# Patient Record
Sex: Female | Born: 1980 | Race: White | Hispanic: No | Marital: Single | Smoking: Former smoker
Health system: Southern US, Community
[De-identification: ages and names within clinical notes are randomized; demographics above are authoritative.]

## PROBLEM LIST (undated history)

## (undated) ENCOUNTER — Inpatient Hospital Stay: Payer: Self-pay

## (undated) DIAGNOSIS — L732 Hidradenitis suppurativa: Secondary | ICD-10-CM

## (undated) DIAGNOSIS — D649 Anemia, unspecified: Secondary | ICD-10-CM

## (undated) DIAGNOSIS — I10 Essential (primary) hypertension: Secondary | ICD-10-CM

## (undated) DIAGNOSIS — D060 Carcinoma in situ of endocervix: Secondary | ICD-10-CM

## (undated) DIAGNOSIS — I8392 Asymptomatic varicose veins of left lower extremity: Secondary | ICD-10-CM

## (undated) DIAGNOSIS — G5603 Carpal tunnel syndrome, bilateral upper limbs: Secondary | ICD-10-CM

## (undated) DIAGNOSIS — M2550 Pain in unspecified joint: Secondary | ICD-10-CM

## (undated) DIAGNOSIS — O139 Gestational [pregnancy-induced] hypertension without significant proteinuria, unspecified trimester: Secondary | ICD-10-CM

## (undated) DIAGNOSIS — G4730 Sleep apnea, unspecified: Secondary | ICD-10-CM

## (undated) DIAGNOSIS — G43909 Migraine, unspecified, not intractable, without status migrainosus: Secondary | ICD-10-CM

## (undated) DIAGNOSIS — I7300 Raynaud's syndrome without gangrene: Secondary | ICD-10-CM

---

## 2005-01-22 ENCOUNTER — Emergency Department: Payer: Self-pay | Admitting: Emergency Medicine

## 2009-11-13 ENCOUNTER — Ambulatory Visit: Payer: Self-pay | Admitting: Family Medicine

## 2010-01-31 ENCOUNTER — Emergency Department: Payer: Self-pay | Admitting: Emergency Medicine

## 2010-06-18 ENCOUNTER — Ambulatory Visit: Payer: Self-pay | Admitting: Family Medicine

## 2013-10-31 DIAGNOSIS — G4733 Obstructive sleep apnea (adult) (pediatric): Secondary | ICD-10-CM | POA: Insufficient documentation

## 2013-10-31 DIAGNOSIS — G4486 Cervicogenic headache: Secondary | ICD-10-CM | POA: Insufficient documentation

## 2013-12-15 ENCOUNTER — Ambulatory Visit: Payer: Self-pay | Admitting: Neurology

## 2015-11-14 DIAGNOSIS — M2550 Pain in unspecified joint: Secondary | ICD-10-CM | POA: Insufficient documentation

## 2015-11-28 DIAGNOSIS — R937 Abnormal findings on diagnostic imaging of other parts of musculoskeletal system: Secondary | ICD-10-CM | POA: Insufficient documentation

## 2016-01-09 ENCOUNTER — Ambulatory Visit
Admission: EM | Admit: 2016-01-09 | Discharge: 2016-01-09 | Disposition: A | Discharge status: Home / Self Care | Payer: Managed Care, Other (non HMO) | Attending: Family Medicine | Admitting: Family Medicine

## 2016-01-09 ENCOUNTER — Encounter: Payer: Self-pay | Admitting: Emergency Medicine

## 2016-01-09 DIAGNOSIS — N390 Urinary tract infection, site not specified: Secondary | ICD-10-CM

## 2016-01-09 HISTORY — DX: Pain in unspecified joint: M25.50

## 2016-01-09 HISTORY — DX: Migraine, unspecified, not intractable, without status migrainosus: G43.909

## 2016-01-09 LAB — PREGNANCY, URINE: Preg Test, Ur: NEGATIVE

## 2016-01-09 LAB — URINALYSIS COMPLETE WITH MICROSCOPIC (ARMC ONLY)
BILIRUBIN URINE: NEGATIVE
GLUCOSE, UA: NEGATIVE mg/dL
KETONES UR: NEGATIVE mg/dL
Nitrite: POSITIVE — AB
Protein, ur: 30 mg/dL — AB
SPECIFIC GRAVITY, URINE: 1.02 (ref 1.005–1.030)
pH: 5.5 (ref 5.0–8.0)

## 2016-01-09 MED ORDER — ONDANSETRON 8 MG PO TBDP
8.0000 mg | ORAL_TABLET | Freq: Two times a day (BID) | ORAL | Status: DC
Start: 2016-01-09 — End: 2017-02-09

## 2016-01-09 MED ORDER — CIPROFLOXACIN HCL 500 MG PO TABS: 500.0000 mg | ORAL_TABLET | Freq: Two times a day (BID) | ORAL | Status: DC

## 2016-01-09 NOTE — ED Provider Notes (Signed)
CSN: [id number]     Arrival date & time 01/09/16  0849 History   First MD Initiated Contact with Patient 01/09/16 [phone]     Chief Complaint  Patient presents with  . Abdominal Pain   (Consider location/radiation/quality/duration/timing/severity/associated sxs/prior Treatment) Patient is a 35 y.o. female presenting with dysuria. The history is provided by the patient.  Dysuria Pain quality:  Burning Pain severity:  Mild Onset quality:  Sudden Duration:  3 days Timing:  Constant Progression:  Worsening Chronicity:  New Recent urinary tract infections: no   Relieved by:  None tried Ineffective treatments:  None tried Urinary symptoms: discolored urine and frequent urination   Urinary symptoms: no foul-smelling urine, no hematuria, no hesitancy and no bladder incontinence   Associated symptoms: abdominal pain and nausea   Associated symptoms: no fever, no flank pain, no genital lesions, no vaginal discharge and no vomiting   Risk factors: no hx of pyelonephritis, no hx of urolithiasis, no kidney transplant, not pregnant, no recurrent urinary tract infections, no renal cysts, no renal disease, not sexually active, not single kidney, no sexually transmitted infections and no urinary catheter     Past Medical History  Diagnosis Date  . Joint pain   . Migraines    History reviewed. No pertinent past surgical history. History reviewed. No pertinent family history. Social History  Substance Use Topics  . Smoking status: Former Games developer  . Smokeless tobacco: None  . Alcohol Use: Yes   OB History    No data available     Review of Systems  Constitutional: Negative for fever.  Gastrointestinal: Positive for nausea and abdominal pain. Negative for vomiting.  Genitourinary: Positive for dysuria. Negative for flank pain and vaginal discharge.    Allergies  Penicillins  Home Medications   Prior to Admission medications   Medication Sig Start Date End Date Taking? Authorizing  Provider  Cyclobenzaprine HCl (FLEXERIL PO) Take by mouth.   Yes Historical Provider, MD  MELOXICAM PO Take 7.5 mg by mouth.   Yes Historical Provider, MD  Norgestim-Eth Estrad Triphasic (TRI-PREVIFEM PO) Take by mouth.   Yes Historical Provider, MD  promethazine (PHENERGAN) 12.5 MG tablet Take 25 mg by mouth every 6 (six) hours as needed for nausea or vomiting.   Yes Historical Provider, MD  SUMATRIPTAN SUCCINATE PO Take by mouth.   Yes Historical Provider, MD  topiramate (TOPAMAX) 100 MG tablet Take 100 mg by mouth 2 (two) times daily.   Yes Historical Provider, MD  ciprofloxacin (CIPRO) 500 MG tablet Take 1 tablet (500 mg total) by mouth every 12 (twelve) hours. 01/09/16   Payton Mccallum, MD  ondansetron (ZOFRAN ODT) 8 MG disintegrating tablet Take 1 tablet (8 mg total) by mouth 2 (two) times daily. 01/09/16   Payton Mccallum, MD   Meds Ordered and Administered this Visit  Medications - No data to display  BP 132/71 mmHg  Pulse 61  Temp(Src) 98.1 F (36.7 C) (Oral)  Resp 16  Ht  (1.626 m)  Wt 175 lb (79.379 kg)  BMI 30.02 kg/m2  SpO2 100%  LMP 12/05/2015 (Approximate) No data found.   Physical Exam  Constitutional: She appears well-developed and well-nourished. No distress.  Abdominal: Soft. Bowel sounds are normal. She exhibits no distension and no mass. There is tenderness (mild, suprapubic). There is no rebound and no guarding.  Skin: She is not diaphoretic.  Nursing note and vitals reviewed.   ED Course  Procedures (including critical care time)  Labs Review Labs Reviewed  URINALYSIS COMPLETEWITH MICROSCOPIC ([facility] ONLY) - Abnormal; Notable for the following:    APPearance HAZY (*)    Hgb urine dipstick MODERATE (*)    Protein, ur 30 (*)    Nitrite POSITIVE (*)    Leukocytes, UA SMALL (*)    Bacteria, UA MANY (*)    Squamous Epithelial / LPF 0-5 (*)    All other components within normal limits  PREGNANCY, URINE    Imaging Review No results found.   Visual  Acuity Review  Right Eye Distance:   Left Eye Distance:   Bilateral Distance:    Right Eye Near:   Left Eye Near:    Bilateral Near:         MDM   1. UTI (lower urinary tract infection)    Discharge Medication List as of 01/09/2016 10:45 AM    START taking these medications   Details  ciprofloxacin (CIPRO) 500 MG tablet Take 1 tablet (500 mg total) by mouth every 12 (twelve) hours., Starting 01/09/2016, Until Discontinued, Normal    ondansetron (ZOFRAN ODT) 8 MG disintegrating tablet Take 1 tablet (8 mg total) by mouth 2 (two) times daily., Starting 01/09/2016, Until Discontinued, Normal       1. diagnosis reviewed with patient 2. rx as per orders above; reviewed possible side effects, interactions, risks and benefits  3. Recommend supportive treatment with increased water intake  4. Follow-up prn if symptoms worsen or don't improve    Payton Mccallumrlando Keegan Bensch, MD 01/09/16 1059

## 2016-01-09 NOTE — Discharge Instructions (Signed)
Asymptomatic Bacteriuria, Female Asymptomatic bacteriuria is the presence of a large number of bacteria in your urine without the usual symptoms of burning or frequent urination. The following conditions increase the risk of asymptomatic bacteriuria:  Diabetes mellitus.  Advanced age.  Pregnancy in the first trimester.  Kidney stones.  Kidney transplants.  Leaky kidney tube valve in young children (reflux). Treatment for this condition is not needed in most people and can lead to other problems such as too much yeast and growth of resistant bacteria. However, some people, such as pregnant women, do need treatment to prevent kidney infection. Asymptomatic bacteriuria in pregnancy is also associated with fetal growth restriction, premature labor, and newborn death. HOME CARE INSTRUCTIONS Monitor your condition for any changes. The following actions may help to relieve any discomfort you are feeling:  Drink enough water and fluids to keep your urine clear or pale yellow. Go to the bathroom more often to keep your bladder empty.  Keep the area around your vagina and rectum clean. Wipe yourself from front to back after urinating. SEEK IMMEDIATE MEDICAL CARE IF:  You develop signs of an infection such as:  Burning with urination.  Frequency of voiding.  Back pain.  Fever.  You have blood in the urine.  You develop a fever. MAKE SURE YOU:  Understand these instructions.  Will watch your condition.  Will get help right away if you are not doing well or get worse.   This information is not intended to replace advice given to you by your health care provider. Make sure you discuss any questions you have with your health care provider.   Document Released: 06/09/2005 Document Revised: 06/30/2014 Document Reviewed: 11/29/2012 Elsevier Interactive Patient Education 2016 [employer] Inc[employer]  

## 2016-01-09 NOTE — ED Notes (Signed)
Pt reports 3-4 days of abdominal pain, had some urinary frequency and urgency but that went away. Pt reports on birth control and not having period yet. Pt has nausea and lower abd pain. Denies fever or chills.

## 2016-04-02 DIAGNOSIS — M542 Cervicalgia: Secondary | ICD-10-CM | POA: Insufficient documentation

## 2016-10-01 DIAGNOSIS — I7300 Raynaud's syndrome without gangrene: Secondary | ICD-10-CM | POA: Insufficient documentation

## 2017-02-09 ENCOUNTER — Ambulatory Visit
Admission: EM | Admit: 2017-02-09 | Discharge: 2017-02-09 | Disposition: A | Discharge status: Home / Self Care | Payer: Managed Care, Other (non HMO) | Attending: Family Medicine | Admitting: Family Medicine

## 2017-02-09 ENCOUNTER — Encounter: Payer: Self-pay | Admitting: Emergency Medicine

## 2017-02-09 DIAGNOSIS — M545 Low back pain: Secondary | ICD-10-CM

## 2017-02-09 DIAGNOSIS — S39012A Strain of muscle, fascia and tendon of lower back, initial encounter: Secondary | ICD-10-CM

## 2017-02-09 MED ORDER — HYDROCODONE-ACETAMINOPHEN 5-325 MG PO TABS: ORAL_TABLET | ORAL | 0 refills | Status: DC
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
## 2017-06-23 NOTE — L&D Delivery Note (Signed)
Delivery Note Date of delivery: 01/26/2018 Estimated Date of Delivery: 02/15/18 Patient's last menstrual period was 06/06/2017. EGA: 6221w1d  First Stage: Induction: misoprostol Augmentation: AROM, pitocin Analgesia Eliezer Lofts/Anesthesia intrapartum: epidural AROM at 1400 01/25/18  Theresa Davidson presented to L&D for IOL due to gHTN. She was induced with misoprostol, then augmented with AROM and pitocin. Epidural placed for pain relief.   Second Stage: Complete dilation at 1002 01/26/18 Onset of pushing at 1026 01/26/18 FHR second stage: category II for variable decels with contractions, overall reassuring with moderate variability and accels Delivery at 1244 on 01/26/2018  She slowly progressed to complete, with pitocin paused multiple times for periods of decelerations. She had a spontaneous vaginal birth of a live female over an intact perineum. The fetal head was delivered in direct OA position with restitution to LOA. No nuchal cord. Anterior then posterior shoulders delivered spontaneously. Baby placed on mom's abdomen and attended to by transition RN. Cord clamped after 3-4 minute delay by aunt of the baby. Cord blood obtained for newborn labs.  Third Stage: Placenta delivered spontaneously intact with 3VC at 1258 Placenta disposition: routine disposal Uterine tone firm / bleeding scant IV pitocin given for hemorrhage prophylyaxis  B/l labial and 1st degree left vaginal wall lacerations identified  Anesthesia for repair: epidural Repair: 3-0 Vicryl Rapide CT to left labial and left vaginal wall Quantitative Blood Loss (mL): 237  Complications: none  Mom to postpartum.  Baby to Couplet care / Skin to Skin.  Newborn: Birth Weight: 6lb 9.8oz 3000g  Apgar Scores: 9, 9 Feeding planned: formula   Genia DelMargaret Abe Schools, CNM 01/26/2018 1:29 PM

## 2017-09-08 ENCOUNTER — Other Ambulatory Visit: Payer: Self-pay | Admitting: Obstetrics and Gynecology

## 2017-09-08 DIAGNOSIS — Z1231 Encounter for screening mammogram for malignant neoplasm of breast: Secondary | ICD-10-CM

## 2017-09-15 ENCOUNTER — Encounter (INDEPENDENT_AMBULATORY_CARE_PROVIDER_SITE_OTHER): Payer: Self-pay

## 2017-09-15 ENCOUNTER — Ambulatory Visit
Admission: RE | Admit: 2017-09-15 | Discharge: 2017-09-15 | Disposition: A | Discharge status: Home / Self Care | Payer: Commercial Managed Care - PPO | Source: Ambulatory Visit | Attending: Obstetrics and Gynecology | Admitting: Obstetrics and Gynecology

## 2017-09-15 DIAGNOSIS — Z1231 Encounter for screening mammogram for malignant neoplasm of breast: Secondary | ICD-10-CM | POA: Diagnosis not present

## 2017-09-29 DIAGNOSIS — O2390 Unspecified genitourinary tract infection in pregnancy, unspecified trimester: Secondary | ICD-10-CM | POA: Insufficient documentation

## 2017-09-29 DIAGNOSIS — O09522 Supervision of elderly multigravida, second trimester: Secondary | ICD-10-CM | POA: Insufficient documentation

## 2017-09-29 LAB — OB RESULTS CONSOLE HIV ANTIBODY (ROUTINE TESTING): HIV: NONREACTIVE

## 2017-09-29 LAB — OB RESULTS CONSOLE RPR: RPR: NONREACTIVE

## 2017-09-29 LAB — OB RESULTS CONSOLE VARICELLA ZOSTER ANTIBODY, IGG: VARICELLA IGG: IMMUNE

## 2017-09-29 LAB — OB RESULTS CONSOLE RUBELLA ANTIBODY, IGM: Rubella: IMMUNE

## 2017-09-29 LAB — OB RESULTS CONSOLE GC/CHLAMYDIA
Chlamydia: NEGATIVE
GC PROBE AMP, GENITAL: NEGATIVE

## 2017-09-29 LAB — OB RESULTS CONSOLE HEPATITIS B SURFACE ANTIGEN: HEP B S AG: NEGATIVE

## 2017-10-01 ENCOUNTER — Other Ambulatory Visit: Payer: Self-pay | Admitting: Obstetrics and Gynecology

## 2017-10-01 DIAGNOSIS — Z3689 Encounter for other specified antenatal screening: Secondary | ICD-10-CM

## 2017-10-08 ENCOUNTER — Ambulatory Visit (HOSPITAL_BASED_OUTPATIENT_CLINIC_OR_DEPARTMENT_OTHER)
Admission: RE | Admit: 2017-10-08 | Discharge: 2017-10-08 | Disposition: A | Discharge status: Home / Self Care | Payer: Commercial Managed Care - PPO | Source: Ambulatory Visit | Attending: Obstetrics and Gynecology | Admitting: Obstetrics and Gynecology

## 2017-10-08 ENCOUNTER — Ambulatory Visit
Admission: RE | Admit: 2017-10-08 | Discharge: 2017-10-08 | Disposition: A | Discharge status: Home / Self Care | Payer: Commercial Managed Care - PPO | Source: Ambulatory Visit | Attending: Maternal & Fetal Medicine | Admitting: Maternal & Fetal Medicine

## 2017-10-08 ENCOUNTER — Ambulatory Visit: Payer: Commercial Managed Care - PPO

## 2017-10-08 DIAGNOSIS — O281 Abnormal biochemical finding on antenatal screening of mother: Secondary | ICD-10-CM | POA: Insufficient documentation

## 2017-10-08 DIAGNOSIS — O09512 Supervision of elderly primigravida, second trimester: Secondary | ICD-10-CM | POA: Diagnosis not present

## 2017-10-08 DIAGNOSIS — O280 Abnormal hematological finding on antenatal screening of mother: Secondary | ICD-10-CM | POA: Diagnosis not present

## 2017-10-08 DIAGNOSIS — Z3689 Encounter for other specified antenatal screening: Secondary | ICD-10-CM

## 2017-10-08 DIAGNOSIS — Z3A21 21 weeks gestation of pregnancy: Secondary | ICD-10-CM | POA: Diagnosis not present

## 2017-10-08 DIAGNOSIS — O09513 Supervision of elderly primigravida, third trimester: Secondary | ICD-10-CM | POA: Insufficient documentation

## 2017-10-08 NOTE — Progress Notes (Addendum)
Referring Provider:   Milon Score Length of Consultation:40 minutes  Ms. Snooks was referred to [facility] for genetic counseling because of maternal age and an abnormal maternal serum screen.  The patient will be 37 years old at the time of delivery.  This note summarizes the information we discussed.    We explained that the chance of a chromosome abnormality increases with maternal age.  Chromosomes and examples of chromosome problems were reviewed.  Humans typically have 46 chromosomes in each cell, with half passed through each sperm and egg.  Any change in the number or structure of chromosomes can increase the risk of problems in the physical and mental development of a pregnancy.   Based upon age of the patient, the chance of any chromosome abnormality was 1 in 54.  The chance of Down syndrome, the most common chromosome problem associated with maternal age, was 1 in 200.  The risk of chromosome problems is in addition to the 3% general population risk for birth defects and mental retardation.  The greatest chance, of course, is that the baby would be born in good health.  Maternal serum marker screening, a blood test that measures pregnancy proteins, can provide risk assessments for Down syndrome, trisomy 18, and open neural tube defects (spina bifida, anencephaly). Because it does not directly examine the fetus, it cannot positively diagnose or rule out these problems. Ms. Manzer had maternal serum screening through her ob/gyn.  The maternal serum screen revealed protein levels that increased the chance of Down syndrome in the pregnancy.  Given the maternal serum screen results, the chance for the baby to have Down Syndrome is now estimated to be 1 in 191.  This means that the chance the baby does not have Down syndrome is greater than 99 percent.  We discussed the following prenatal screening and testing options for this pregnancy:  Targeted ultrasound uses high  frequency sound waves to create an image of the developing fetus.  An ultrasound is often recommended as a routine means of evaluating the pregnancy.  It is also used to screen for fetal anatomy problems (for example, a heart defect) that might be suggestive of a chromosomal or other abnormality.   Amniocentesis involves the removal of a small amount of amniotic fluid from the sac surrounding the fetus with the use of a thin needle inserted through the maternal abdomen and uterus.  Ultrasound guidance is used throughout the procedure.  Fetal cells from amniotic fluid are directly evaluated and > 99.5% of chromosome problems and > 98% of open neural tube defects can be detected. This procedure is generally performed after the 15th week of pregnancy.  The main risks to this procedure include complications leading to miscarriage in less than 1 in 200 cases (0.5%).  We also reviewed the availability of cell free fetal DNA testing from maternal blood to determine whether or not the baby may have either Down syndrome, trisomy 76, or trisomy 45.  This test utilizes a maternal blood sample and DNA sequencing technology to isolate circulating cell free fetal DNA from maternal plasma.  The fetal DNA can then be analyzed for DNA sequences that are derived from the three most common chromosomes involved in aneuploidy, chromosomes 13, 18, and 21.  If the overall amount of DNA is greater than the expected level for any of these chromosomes, aneuploidy is suspected.  While we do not consider it a replacement for invasive testing and karyotype analysis, a negative result  from this testing would be reassuring, though not a guarantee of a normal chromosome complement for the baby.  An abnormal result is certainly suggestive of an abnormal chromosome complement, though we would still recommend amniocentesis to confirm any findings from this testing.  Cystic Fibrosis and Spinal Muscular Atrophy (SMA) screening were also discussed  with the patient. Both conditions are recessive, which means that both parents must be carriers in order to have a child with the disease.  Cystic fibrosis (CF) is one of the most common genetic conditions in persons of Caucasian ancestry.  This condition occurs in approximately 1 in 2,500 Caucasian persons and results in thickened secretions in the lungs, digestive, and reproductive systems.  For a baby to be at risk for having CF, both of the parents must be carriers for this condition.  Approximately 1 in 3 Caucasian persons is a carrier for CF.  Current carrier testing looks for the most common mutations in the gene for CF and can detect approximately 90% of carriers in the Caucasian population.  This means that the carrier screening can greatly reduce, but cannot eliminate, the chance for an individual to have a child with CF.  If an individual is found to be a carrier for CF, then carrier testing would be available for the partner. As part of [place]'s newborn screening profile, all babies born in the state of [place] will have a two-tier screening process.  Specimens are first tested to determine the concentration of immunoreactive trypsinogen (IRT).  The top 5% of specimens with the highest IRT values then undergo DNA testing using a panel of over 40 common CF mutations. SMA is a neurodegenerative disorder that leads to atrophy of skeletal muscle and overall weakness.  This condition is also more prevalent in the Caucasian population, with 1 in 40-1 in 60 persons being a carrier and 1 in 6,000-1 in 10,000 children being affected.  There are multiple forms of the disease, with some causing death in infancy to other forms with survival into adulthood.  The genetics of SMA is complex, but carrier screening can detect up to 95% of carriers in the Caucasian population.  Similar to CF, a negative result can greatly reduce, but cannot eliminate, the chance to have a child with SMA.  We obtained a  detailed family history and pregnancy history.  The patient reported that this is her first pregnancy.  The father of the baby has two healthy children from a prior relationship.  The patient stated that her father and one of his brothers have been diagnosed with retinitis pigmentosa (RP), an inherited eye disorder.  There are many forms of RP that may be inherited in different ways.  Without additional information about possible genetic testing that they have had, it is difficult to determine the specific risks for other family members to have RP.  If more is learned, we are happy to discuss this further.  She also stated that her mother passed away from complications or scleroderma and lupus.  These autoimmune conditions are not uncommon to occur together in individuals.  Ms. Garlick's sister is also said to have lupus.  It is known that a small percentage of all cases of systemic scleroderma have been reported to run in families; however, the condition does not have a clear pattern of inheritance. Multiple genetic and environmental factors likely play a part in determining the risk of developing this condition.  This is likely also the case for lupus. The remainder  of the family history was reported to be unremarkable for birth defects, developmental delays, recurrent pregnancy loss or known chromosome abnormalities.   Ms.  Stephania FragminHelm reported no complications in this pregnancy.  She did report taking Qudexy for migraines prior to learning she was pregnant at [redacted] weeks gestation.  We reviewed that there is limited data on this medication, but that one study did suggest an increased chance for cleft lip or palate.  The nose/lips views were normal today by ultrasound.  She is also taking Maloxicam, a non steriodal anti-inflammatory medication.  There is little data on the use of this medication in human pregnancy, but animal studies have not shown an increased risk.  Similar medications are not recommended late in  gestation due to the risk for premature closure of the PDA.  After consideration of the options, the patient elected to proceed with an ultrsaound only.  She declined cell free fetal DNA testing as well as amniocentesis.  She also declined CF and SMA carrier screening.  An ultrasound was performed at the time of the visit.  The gestational age was consistent with 21 weeks and 3 days.  No markers of aneuploidy were seen.  It is important to remember that ultrasound can not diagnose chromosome abnormalities or all birth defects.  Ms. Stephania FragminHelm was encouraged to call with questions or concerns.  We can be contacted at [phone].  Cherly Andersoneborah F. Wells, MS, CGC  I was immediately available and supervising. Argentina PonderAndra H. Arlington Sigmund, MD [facility]

## 2017-11-05 NOTE — Addendum Note (Signed)
Encounter addended by: Ezzie Senat, MD on: 11/05/2017 8:42 AM  Actions taken: Sign clinical note

## 2017-11-27 ENCOUNTER — Observation Stay
Admission: EM | Admit: 2017-11-27 | Discharge: 2017-11-27 | Disposition: A | Discharge status: Home / Self Care | Payer: Commercial Managed Care - PPO | Attending: Obstetrics & Gynecology | Admitting: Obstetrics & Gynecology

## 2017-11-27 ENCOUNTER — Encounter: Payer: Self-pay | Admitting: *Deleted

## 2017-11-27 DIAGNOSIS — O368130 Decreased fetal movements, third trimester, not applicable or unspecified: Secondary | ICD-10-CM | POA: Diagnosis present

## 2017-11-27 DIAGNOSIS — Z3A28 28 weeks gestation of pregnancy: Secondary | ICD-10-CM | POA: Insufficient documentation

## 2017-11-27 DIAGNOSIS — Z3490 Encounter for supervision of normal pregnancy, unspecified, unspecified trimester: Secondary | ICD-10-CM

## 2017-11-27 NOTE — OB Triage Note (Signed)
Recvd pt from ED. Pt c/o of decreased fetal movement since yesterday morning. No complaints of contractions, vaginal bleeding or LOF.

## 2017-11-27 NOTE — Discharge Instructions (Signed)
Come back if: ° °Big gush of fluids °Decreased fetal movement °Temp over 100.4 °Heavy vaginal bleeding °Contractions every 3-5 min lasting at least one hour ° °Get plenty of rest and stay well hydrated! °

## 2017-11-27 NOTE — Discharge Summary (Signed)
Patient's last menstrual period was 06/06/2017. EDC: Estimated Date of Delivery: 02/15/18 EGA: 4251w4d  Patient presented for evaluation of decreased fetal movement.  Patient had not felt fetal movement in 24 hours, so reported here.  Upon arrival, she felt fetal activity, which was reported to me. I reviewed her vital signs and fetal tracing, both of which were reassuring.  Patient was discharged as fetal movement had normalized.  NST interpretation: Reactive.  Ranae Plumberhelsea Ward, MD Attending Obstetrician and Gynecologist [facility]

## 2018-01-15 DIAGNOSIS — O280 Abnormal hematological finding on antenatal screening of mother: Secondary | ICD-10-CM | POA: Insufficient documentation

## 2018-01-21 ENCOUNTER — Other Ambulatory Visit: Payer: Self-pay | Admitting: Obstetrics and Gynecology

## 2018-01-21 ENCOUNTER — Other Ambulatory Visit: Payer: Self-pay

## 2018-01-21 ENCOUNTER — Observation Stay
Admission: EM | Admit: 2018-01-21 | Discharge: 2018-01-21 | Disposition: A | Discharge status: Home / Self Care | Payer: Commercial Managed Care - PPO | Attending: Obstetrics and Gynecology | Admitting: Obstetrics and Gynecology

## 2018-01-21 DIAGNOSIS — Z3A36 36 weeks gestation of pregnancy: Secondary | ICD-10-CM | POA: Diagnosis not present

## 2018-01-21 DIAGNOSIS — Z79899 Other long term (current) drug therapy: Secondary | ICD-10-CM | POA: Diagnosis not present

## 2018-01-21 DIAGNOSIS — R030 Elevated blood-pressure reading, without diagnosis of hypertension: Secondary | ICD-10-CM | POA: Diagnosis present

## 2018-01-21 DIAGNOSIS — Z880 Allergy status to penicillin: Secondary | ICD-10-CM | POA: Insufficient documentation

## 2018-01-21 DIAGNOSIS — D696 Thrombocytopenia, unspecified: Secondary | ICD-10-CM | POA: Diagnosis not present

## 2018-01-21 DIAGNOSIS — Z87891 Personal history of nicotine dependence: Secondary | ICD-10-CM | POA: Insufficient documentation

## 2018-01-21 DIAGNOSIS — O99113 Other diseases of the blood and blood-forming organs and certain disorders involving the immune mechanism complicating pregnancy, third trimester: Secondary | ICD-10-CM | POA: Diagnosis not present

## 2018-01-21 LAB — PROTEIN / CREATININE RATIO, URINE
CREATININE, URINE: 45 mg/dL
Total Protein, Urine: <6 mg/dL

## 2018-01-21 LAB — COMPREHENSIVE METABOLIC PANEL
ALK PHOS: 172 U/L — AB (ref 38–126)
ALT: 14 U/L (ref 0–44)
ANION GAP: 6 (ref 5–15)
AST: 22 U/L (ref 15–41)
Albumin: 2.8 g/dL — ABNORMAL LOW (ref 3.5–5.0)
BUN: 8 mg/dL (ref 6–20)
CALCIUM: 9.1 mg/dL (ref 8.9–10.3)
CHLORIDE: 106 mmol/L (ref 98–111)
CO2: 25 mmol/L (ref 22–32)
CREATININE: 0.48 mg/dL (ref 0.44–1.00)
GFR calc non Af Amer: >60 mL/min (ref >60)
Glucose, Bld: 83 mg/dL (ref 70–99)
Potassium: 4 mmol/L (ref 3.5–5.1)
SODIUM: 137 mmol/L (ref 135–145)
Total Bilirubin: 0.6 mg/dL (ref 0.3–1.2)
Total Protein: 6.2 g/dL — ABNORMAL LOW (ref 6.5–8.1)

## 2018-01-21 LAB — URINALYSIS, ROUTINE W REFLEX MICROSCOPIC
Bilirubin Urine: NEGATIVE
GLUCOSE, UA: NEGATIVE mg/dL
Ketones, ur: NEGATIVE mg/dL
Leukocytes, UA: NEGATIVE
Nitrite: POSITIVE — AB
PH: 6 (ref 5.0–8.0)
Protein, ur: NEGATIVE mg/dL
SPECIFIC GRAVITY, URINE: 1.01 (ref 1.005–1.030)

## 2018-01-21 LAB — OB RESULTS CONSOLE GBS: GBS: NEGATIVE

## 2018-01-21 LAB — CBC
HCT: 36 % (ref 35.0–47.0)
HEMOGLOBIN: 12.3 g/dL (ref 12.0–16.0)
MCH: 30.5 pg (ref 26.0–34.0)
MCHC: 34.3 g/dL (ref 32.0–36.0)
MCV: 89 fL (ref 80.0–100.0)
PLATELETS: 143 10*3/uL — AB (ref 150–440)
RBC: 4.05 MIL/uL (ref 3.80–5.20)
RDW: 13.3 % (ref 11.5–14.5)
WBC: 9.3 10*3/uL (ref 3.6–11.0)

## 2018-01-21 MED ORDER — CEPHALEXIN 500 MG PO CAPS: 500.0000 mg | ORAL_CAPSULE | Freq: Four times a day (QID) | ORAL | 0 refills | Status: DC

## 2018-01-21 MED ORDER — ACETAMINOPHEN 325 MG PO TABS: 650.0000 mg | ORAL_TABLET | ORAL | Status: DC | PRN

## 2018-01-21 MED ORDER — ACETAMINOPHEN 500 MG PO TABS
1000.0000 mg | ORAL_TABLET | ORAL | Status: DC | PRN
  Administered 2018-01-21: 1000 mg via ORAL
  Filled 2018-01-21: qty 2

## 2018-01-21 NOTE — Discharge Summary (Signed)
Theresa Davidson is a 37 y.o. female. She is at 5071w3d gestation. Patient's last menstrual period was 06/06/2017. Estimated Date of Delivery: 02/15/18  [facility]   Current pregnancy complicated by:   AMA  Unplanned pregnancy  [facility] did not know she was pregnant till 17 weeks   Chief complaint:elevated BP in office, mild HA since waking up, has not taken medication.   Location: HA all day today, mild but constant. Also having worsening edema.    S: Resting comfortably. no CTX, no VB.no LOF,  Active fetal movement. Denies: visual changes, SOB, or RUQ/epigastric pain.   Maternal Medical History:   Past Medical History:  Diagnosis Date  . Joint pain   . Migraines     History reviewed. No pertinent surgical history.  Allergies  Allergen Reactions  . Penicillins Other (See Comments)    Doesn't know symptoms last had it as a child     Prior to Admission medications   Medication Sig Start Date End Date Taking? Authorizing Provider  acetaminophen (TYLENOL) 325 MG tablet Take 650 mg by mouth every 6 (six) hours as needed.   Yes [provider]  Prenatal Vit-Fe Fumarate-FA (PRENATAL MULTIVITAMIN) TABS tablet Take 1 tablet by mouth daily at 12 noon.   Yes [provider]  vitamin B-12 (CYANOCOBALAMIN) 1000 MCG tablet Take 1,000 mcg by mouth daily.   Yes [provider]  HYDROcodone-acetaminophen (NORCO/VICODIN) 5-325 MG tablet 1-2 tabs po qhs prn Patient not taking: Reported on 10/08/2017 02/09/17   Payton Mccallumonty, [place], MD  Norgestim-Eth Estrad Triphasic (TRI-PREVIFEM PO) Take by mouth.    [provider]  SUMATRIPTAN SUCCINATE PO Take by mouth.    [provider]  Topiramate ER (QUDEXY XR) 200 MG CS24 sprinkle capsule Take 1 capsule by mouth daily.    [provider]      Social History: She  reports that she has quit smoking. She has never used smokeless tobacco. She reports that she drank  alcohol. She reports that she does not use drugs.  Family History:  no history of gyn cancers  Review of Systems: A full review of systems was performed and negative except as noted in the HPI.     O:  BP 138/84   Pulse 95   Temp 98.1 F (36.7 C) (Oral)   Resp 14   Ht 5\' 5"  (1.651 m)   Wt 223 lb (101.2 kg)   LMP 06/06/2017 Comment: periods are irregular, not preg  BMI 37.11 kg/m  Results for orders placed or performed during the hospital encounter of 01/21/18 (from the past 48 hour(s))  Protein / creatinine ratio, urine   Collection Time: 01/21/18 10:25 AM  Result Value Ref Range   Creatinine, Urine 45 mg/dL   Total Protein, Urine <6 mg/dL   Protein Creatinine Ratio        0.00 - 0.15 mg/mg[Cre]  Urinalysis, Routine w reflex microscopic   Collection Time: 01/21/18 10:27 AM  Result Value Ref Range   Color, Urine YELLOW (A) YELLOW   APPearance HAZY (A) CLEAR   Specific Gravity, Urine 1.010 1.005 - 1.030   pH 6.0 5.0 - 8.0   Glucose, UA NEGATIVE NEGATIVE mg/dL   Hgb urine dipstick MODERATE (A) NEGATIVE   Bilirubin Urine NEGATIVE NEGATIVE   Ketones, ur NEGATIVE NEGATIVE mg/dL   Protein, ur NEGATIVE NEGATIVE mg/dL   Nitrite POSITIVE (A) NEGATIVE   Leukocytes, UA NEGATIVE NEGATIVE   RBC /  HPF 0-5 0 - 5 RBC/hpf   WBC, UA 0-5 0 - 5 WBC/hpf   Bacteria, UA RARE (A) NONE SEEN   Squamous Epithelial / LPF 0-5 0 - 5   Mucus PRESENT   Comprehensive metabolic panel   Collection Time: 01/21/18 10:35 AM  Result Value Ref Range   Sodium 137 135 - 145 mmol/L   Potassium 4.0 3.5 - 5.1 mmol/L   Chloride 106 98 - 111 mmol/L   CO2 25 22 - 32 mmol/L   Glucose, Bld 83 70 - 99 mg/dL   BUN 8 6 - 20 mg/dL   Creatinine, Ser 1.61 0.44 - 1.00 mg/dL   Calcium 9.1 8.9 - 09.6 mg/dL   Total Protein 6.2 (L) 6.5 - 8.1 g/dL   Albumin 2.8 (L) 3.5 - 5.0 g/dL   AST 22 15 - 41 U/L   ALT 14 0 - 44 U/L   Alkaline Phosphatase 172 (H) 38 - 126 U/L   Total Bilirubin 0.6 0.3 - 1.2 mg/dL   GFR calc non Af  Amer >60 >60 mL/min   GFR calc Af Amer >60 >60 mL/min   Anion gap 6 5 - 15  CBC on admission   Collection Time: 01/21/18 10:35 AM  Result Value Ref Range   WBC 9.3 3.6 - 11.0 K/uL   RBC 4.05 3.80 - 5.20 MIL/uL   Hemoglobin 12.3 12.0 - 16.0 g/dL   HCT 04.5 40.9 - 81.1 %   MCV 89.0 80.0 - 100.0 fL   MCH 30.5 26.0 - 34.0 pg   MCHC 34.3 32.0 - 36.0 g/dL   RDW 91.4 78.2 - 95.6 %   Platelets 143 (L) 150 - 440 K/uL     Constitutional: NAD, AAOx3  HE/ENT: extraocular movements grossly intact, moist mucous membranes CV: RRR PULM: nl respiratory effort, CTABL     Abd: gravid, non-tender, non-distended, soft      Ext: Non-tender, Nonedematous   Psych: mood appropriate, speech normal Pelvic: SVE: 1/50/-2, medium/posterior  Fetal  monitoring: Cat I Appropriate for GA Baseline: 140bpm Variability: moderate Accelerations: present x >2 Decelerations absent Toco: irreg UCs, feels mild cramping only    A/P: 37 y.o. [redacted]w[redacted]d here for antenatal surveillance for GHTN  GHTN with mild HA that resolved after tylenol.  Dx based on elevated BPs in office and on L&D, repeat NST to be scheduled on 01/23/18 and tentative IOL on 8/5 if BP remains elevated on 8/3  Labs today-  mild thrombocytopenia 143k (at 28wks 170k)  UA with + nitrites, culture pending, Rx Keflex today  Normal P/C and CMP  Fetal Wellbeing: Reassuring Cat 1 tracing.  D/c home stable, precautions reviewed, follow-up as scheduled.    Aleene Swanner A, CNM 01/21/2018  1:11 PM

## 2018-01-21 NOTE — Progress Notes (Signed)
Orders for admission, unknown GBS- done 01/21/18

## 2018-01-21 NOTE — OB Triage Note (Signed)
[facility] Monitors applied and assessing. VSS. Monitors applied and assessing,.

## 2018-01-23 ENCOUNTER — Observation Stay
Admit: 2018-01-23 | Discharge: 2018-01-23 | Disposition: A | Discharge status: Home / Self Care | Payer: Commercial Managed Care - PPO | Attending: Obstetrics and Gynecology | Admitting: Obstetrics and Gynecology

## 2018-01-23 ENCOUNTER — Other Ambulatory Visit: Payer: Self-pay

## 2018-01-23 DIAGNOSIS — O133 Gestational [pregnancy-induced] hypertension without significant proteinuria, third trimester: Secondary | ICD-10-CM

## 2018-01-23 DIAGNOSIS — O09513 Supervision of elderly primigravida, third trimester: Secondary | ICD-10-CM

## 2018-01-23 DIAGNOSIS — Z3A36 36 weeks gestation of pregnancy: Secondary | ICD-10-CM

## 2018-01-23 DIAGNOSIS — Z880 Allergy status to penicillin: Secondary | ICD-10-CM | POA: Insufficient documentation

## 2018-01-23 DIAGNOSIS — Z87891 Personal history of nicotine dependence: Secondary | ICD-10-CM

## 2018-01-23 DIAGNOSIS — O139 Gestational [pregnancy-induced] hypertension without significant proteinuria, unspecified trimester: Secondary | ICD-10-CM | POA: Diagnosis present

## 2018-01-23 LAB — PROTEIN / CREATININE RATIO, URINE
Creatinine, Urine: 206 mg/dL
Protein Creatinine Ratio: 0.1 mg/mg{Cre} (ref 0.00–0.15)
Total Protein, Urine: 20 mg/dL

## 2018-01-23 NOTE — Discharge Instructions (Signed)
Return to hospital for increase in painful contractions, leaking of fluid, vaginal bleeding, or decreased fetal movement. Call on Monday 01/25/18 at 6:45 AM to verify induction for 8 AM. Be sure to eat breakfast before arriving for induction and come in through the Emergency Department to be registered.

## 2018-01-23 NOTE — Progress Notes (Signed)
Patient ID: Theresa LollKelly L Marik, female   DOB: 03/26/1981, 37 y.o.   MRN: [id number]  Theresa Davidson is a 37 y.o. female. She is at 8110w5d gestation. Patient's last menstrual period was 06/06/2017. Estimated Date of Delivery: 02/15/18  [facility]   Chief complaint:being followed for gestational htn . Scheduled for induction on 01/25/18 Denies significant H/A or vision chnage   S: Resting comfortably. no CTX, no VB.no LOF,  Active fetal movement.   Maternal Medical History:   Past Medical History:  Diagnosis Date  . Joint pain   . Migraines     History reviewed. No pertinent surgical history.  Allergies  Allergen Reactions  . Penicillins Other (See Comments)    Doesn't know symptoms last had it as a child     Prior to Admission medications   Medication Sig Start Date End Date Taking? Authorizing Provider  acetaminophen (TYLENOL) 325 MG tablet Take 650 mg by mouth every 6 (six) hours as needed.   Yes [provider]  cephALEXin (KEFLEX) 500 MG capsule Take 1 capsule (500 mg total) by mouth 4 (four) times daily for 10 days. 01/21/18 01/31/18 Yes McVey, Prudencio Pairebecca A, CNM  Prenatal Vit-Fe Fumarate-FA (PRENATAL MULTIVITAMIN) TABS tablet Take 1 tablet by mouth daily at 12 noon.   Yes [provider]     Social History: She  reports that she has quit smoking. She has never used smokeless tobacco. She reports that she drank alcohol. She reports that she does not use drugs.  Family History: family history is not on file.  no history of gyn cancers  Review of Systems: A full review of systems was performed and negative except as noted in the HPI.    Review of Systems: A full review of systems was performed and negative except as noted in the HPI.   Eyes: no vision change  Ears: left ear pain  Oropharynx: no sore throat  Pulmonary . No shortness of breath , no hemoptysis Cardiovascular: no chest pain , no irregular heart beat  Gastrointestinal:no blood  in stool . No diarrhea, no constipation Uro gynecologic: no dysuria , no pelvic pain Neurologic : no seizure , no migraines    Musculoskeletal: no muscular weakness O:  BP (!) 142/81   Pulse 86   Temp 98.5 F (36.9 C) (Oral)   Resp 18   Ht 5\' 5"  (1.651 m)   Wt 101.2 kg (223 lb)   LMP 06/06/2017 Comment: periods are irregular, not preg  BMI 37.11 kg/m  No results found for this or any previous visit (from the past 48 hour(s)).   Constitutional: NAD, AAOx3  HE/ENT: extraocular movements grossly intact, moist mucous membranes CV: RRR PULM: nl respiratory effort, CTABL     Abd: gravid, non-tender, non-distended, soft      Ext: Non-tender, Nonedmeatous   Psych: mood appropriate, speech normal Pelvic deferred  NST:  Baseline: 150 Variability: moderate Accelerations present x >2 Decelerations absent Time 20mins REACTIVE   lab protein / creatinine ratio pending      A/P: 37 y.o. 8710w5d here for antenatal surveillance for gestational hypertension   if Protein ration c/w preeclampsia I will have her return in am . If not she will RTC for induction Monday  In am    Labor: not present.   Fetal Wellbeing: Reassuring Cat 1 tracing.  Reactive NST   D/c home stable, precautions reviewed, follow-up as scheduled.   ----- Suzy Bouchardhomas J Schermerhorn , MD Attending Obstetrician  and [employer], Department of OB/GYN [facility]

## 2018-01-23 NOTE — OB Triage Note (Signed)
Patient arrived to Obs 2 for scheduled NST. Reports good fetal movement. Denies contractions, leaking of fluid, or vaginal bleeding. Discussed plan of care. Pt verbalized understanding.

## 2018-01-23 NOTE — OB Triage Note (Signed)
Patient given discharge instructions and follow up information verbally and written AVS. Patient verbalized understanding and all questions answered. Discharged in stable condition.

## 2018-01-23 NOTE — Discharge Summary (Signed)
Subjective   Theresa Davidson is a 37 y.o. female. She is at 3179w5d gestation. Patient's last menstrual period was 06/06/2017. Estimated Date of Delivery: 02/15/18  [facility]   Chief complaint:being followed for gestational htn . Scheduled for induction on 01/25/18 Denies significant H/A or vision chnage   S: Resting comfortably. noCTX, no VB.no LOF, Active fetal movement.   Maternal Medical History:       Past Medical History:  Diagnosis Date  . Joint pain   . Migraines     History reviewed. No pertinent surgical history.       Allergies  Allergen Reactions  . Penicillins Other (See Comments)    Doesn't know symptoms last had it as a child            Prior to Admission medications   Medication Sig Start Date End Date Taking? Authorizing Provider  acetaminophen (TYLENOL) 325 MG tablet Take 650 mg by mouth every 6 (six) hours as needed.   Yes [provider]  cephALEXin (KEFLEX) 500 MG capsule Take 1 capsule (500 mg total) by mouth 4 (four) times daily for 10 days. 01/21/18 01/31/18 Yes McVey, Prudencio Pairebecca A, CNM  Prenatal Vit-Fe Fumarate-FA (PRENATAL MULTIVITAMIN) TABS tablet Take 1 tablet by mouth daily at 12 noon.   Yes [provider]     Social History: She  reports that she has quit smoking. She has never used smokeless tobacco. She reports that she drank alcohol. She reports that she does not use drugs.  Family History: family history is not on file.  no history of gyn cancers  Review of Systems: A full review of systems was performed and negative except as noted in the HPI.    Review of Systems: A full review of systems was performed and negativeexcept as noted in the HPI.  Eyes: no vision change  Ears: left ear pain  Oropharynx: no sore throat  Pulmonary . No shortness of breath , no hemoptysis Cardiovascular: no chest pain , no irregular heart beat  Gastrointestinal:no blood in stool . No  diarrhea, no constipation Uro gynecologic: no dysuria , no pelvic pain Neurologic : no seizure , no migraines  Musculoskeletal: no muscular weakness O:   Objective   BP (!) 142/81   Pulse 86   Temp 98.5 F (36.9 C) (Oral)   Resp 18   Ht 5\' 5"  (1.651 m)   Wt 101.2 kg (223 lb)   LMP 06/06/2017 Comment: periods are irregular, not preg  BMI 37.11 kg/m  No results found for this or any previous visit (from the past 48 hour(s)).   Constitutional: NAD, AAOx3  HE/ENT: extraocular movements grossly intact, moist mucous membranes CV: RRR PULM: nl respiratory effort, CTABL                                         Abd: gravid, non-tender, non-distended, soft                                                  Ext: Non-tender, Nonedmeatous                     Psych: mood appropriate, speech normal Pelvic deferred  NST:  Baseline: 150  Variability: moderate Accelerations present x >2 Decelerations absent Time REACTIVE   lab protein / creatinine ratio pending      A/P: 37 y.o. [redacted]w[redacted]d here for antenatal surveillance for gestational hypertension   if Protein ration c/w preeclampsia I will have her return in am . If not she will RTC for induction Monday  In am    Labor: not present.   Fetal Wellbeing: Reassuring Cat 1 tracing.  Reactive NST   D/c home stable, precautions reviewed, follow-up as scheduled.   ----- [name] , MD Attending Obstetrician and Gynecologist [facility], Department of OB/GYN [facility]            Electronically signed by [name], [name], MD at 01/23/2018 1:07 PM

## 2018-01-25 ENCOUNTER — Inpatient Hospital Stay
Admission: EM | Admit: 2018-01-25 | Discharge: 2018-01-28 | DRG: 807 | Disposition: A | Discharge status: Home / Self Care | Payer: Commercial Managed Care - PPO | Attending: Certified Nurse Midwife | Admitting: Certified Nurse Midwife

## 2018-01-25 ENCOUNTER — Inpatient Hospital Stay: Payer: Commercial Managed Care - PPO | Admitting: Anesthesiology

## 2018-01-25 ENCOUNTER — Other Ambulatory Visit: Payer: Self-pay

## 2018-01-25 DIAGNOSIS — Z880 Allergy status to penicillin: Secondary | ICD-10-CM | POA: Diagnosis not present

## 2018-01-25 DIAGNOSIS — Z3A37 37 weeks gestation of pregnancy: Secondary | ICD-10-CM | POA: Diagnosis not present

## 2018-01-25 DIAGNOSIS — O134 Gestational [pregnancy-induced] hypertension without significant proteinuria, complicating childbirth: Secondary | ICD-10-CM | POA: Diagnosis present

## 2018-01-25 DIAGNOSIS — O09513 Supervision of elderly primigravida, third trimester: Secondary | ICD-10-CM

## 2018-01-25 DIAGNOSIS — Z87891 Personal history of nicotine dependence: Secondary | ICD-10-CM

## 2018-01-25 DIAGNOSIS — O133 Gestational [pregnancy-induced] hypertension without significant proteinuria, third trimester: Secondary | ICD-10-CM | POA: Diagnosis present

## 2018-01-25 LAB — URINALYSIS, ROUTINE W REFLEX MICROSCOPIC
BILIRUBIN URINE: NEGATIVE
Bacteria, UA: NONE SEEN
GLUCOSE, UA: NEGATIVE mg/dL
Ketones, ur: 5 mg/dL — AB
Leukocytes, UA: NEGATIVE
Nitrite: NEGATIVE
PH: 6 (ref 5.0–8.0)
Protein, ur: NEGATIVE mg/dL
Specific Gravity, Urine: 1.013 (ref 1.005–1.030)

## 2018-01-25 LAB — PROTEIN / CREATININE RATIO, URINE
Creatinine, Urine: 120 mg/dL
Protein Creatinine Ratio: 0.1 mg/mg{Cre} (ref 0.00–0.15)
Total Protein, Urine: 12 mg/dL

## 2018-01-25 LAB — COMPREHENSIVE METABOLIC PANEL
ALBUMIN: 2.8 g/dL — AB (ref 3.5–5.0)
ALT: 14 U/L (ref 0–44)
AST: 29 U/L (ref 15–41)
Alkaline Phosphatase: 165 U/L — ABNORMAL HIGH (ref 38–126)
Anion gap: 10 (ref 5–15)
BUN: 8 mg/dL (ref 6–20)
CO2: 21 mmol/L — AB (ref 22–32)
Calcium: 8.5 mg/dL — ABNORMAL LOW (ref 8.9–10.3)
Chloride: 105 mmol/L (ref 98–111)
Creatinine, Ser: 0.58 mg/dL (ref 0.44–1.00)
GFR calc Af Amer: >60 mL/min (ref >60)
GFR calc non Af Amer: >60 mL/min (ref >60)
GLUCOSE: 135 mg/dL — AB (ref 70–99)
Potassium: 3.5 mmol/L (ref 3.5–5.1)
SODIUM: 136 mmol/L (ref 135–145)
Total Bilirubin: 0.7 mg/dL (ref 0.3–1.2)
Total Protein: 6.2 g/dL — ABNORMAL LOW (ref 6.5–8.1)

## 2018-01-25 LAB — CBC
HEMATOCRIT: 36.9 % (ref 35.0–47.0)
Hemoglobin: 12.7 g/dL (ref 12.0–16.0)
MCH: 30.5 pg (ref 26.0–34.0)
MCHC: 34.4 g/dL (ref 32.0–36.0)
MCV: 88.6 fL (ref 80.0–100.0)
Platelets: 146 10*3/uL — ABNORMAL LOW (ref 150–440)
RBC: 4.17 MIL/uL (ref 3.80–5.20)
RDW: 13.2 % (ref 11.5–14.5)
WBC: 8 10*3/uL (ref 3.6–11.0)

## 2018-01-25 LAB — TYPE AND SCREEN
ABO/RH(D): O POS
ANTIBODY SCREEN: NEGATIVE

## 2018-01-25 MED ORDER — SOD CITRATE-CITRIC ACID 500-334 MG/5ML PO SOLN: 30.0000 mL | ORAL | Status: DC | PRN

## 2018-01-25 MED ORDER — ACETAMINOPHEN 325 MG PO TABS
650.0000 mg | ORAL_TABLET | ORAL | Status: DC | PRN
  Administered 2018-01-26 (×2): 650 mg via ORAL
  Filled 2018-01-25 (×2): qty 2

## 2018-01-25 MED ORDER — MISOPROSTOL 25 MCG QUARTER TABLET
25.0000 ug | ORAL_TABLET | ORAL | Status: DC | PRN
  Administered 2018-01-25: 25 ug via VAGINAL
  Filled 2018-01-25: qty 1

## 2018-01-25 MED ORDER — EPHEDRINE 5 MG/ML INJ: 10.0000 mg | INTRAVENOUS | Status: DC | PRN

## 2018-01-25 MED ORDER — OXYTOCIN 40 UNITS IN LACTATED RINGERS INFUSION - SIMPLE MED
1.0000 m[IU]/min | INTRAVENOUS | Status: DC
  Administered 2018-01-25: 1 m[IU]/min via INTRAVENOUS
  Administered 2018-01-25: 2 m[IU]/min via INTRAVENOUS
  Filled 2018-01-25: qty 1000

## 2018-01-25 MED ORDER — FENTANYL 2.5 MCG/ML W/ROPIVACAINE 0.15% IN NS 100 ML EPIDURAL (ARMC)
EPIDURAL | Status: AC
  Filled 2018-01-25: qty 100

## 2018-01-25 MED ORDER — OXYTOCIN BOLUS FROM INFUSION
500.0000 mL | Freq: Once | INTRAVENOUS | Status: AC
  Administered 2018-01-26: 500 mL via INTRAVENOUS

## 2018-01-25 MED ORDER — CEFAZOLIN SODIUM-DEXTROSE 1-4 GM/50ML-% IV SOLN: 1.0000 g | Freq: Three times a day (TID) | INTRAVENOUS | Status: DC

## 2018-01-25 MED ORDER — OXYTOCIN 40 UNITS IN LACTATED RINGERS INFUSION - SIMPLE MED
2.5000 [IU]/h | INTRAVENOUS | Status: DC
  Administered 2018-01-26: 2.5 [IU]/h via INTRAVENOUS
  Filled 2018-01-25: qty 1000

## 2018-01-25 MED ORDER — DIPHENHYDRAMINE HCL 50 MG/ML IJ SOLN: 12.5000 mg | INTRAMUSCULAR | Status: DC | PRN

## 2018-01-25 MED ORDER — PHENYLEPHRINE 40 MCG/ML (10ML) SYRINGE FOR IV PUSH (FOR BLOOD PRESSURE SUPPORT): 80.0000 ug | PREFILLED_SYRINGE | INTRAVENOUS | Status: DC | PRN

## 2018-01-25 MED ORDER — TERBUTALINE SULFATE 1 MG/ML IJ SOLN: 0.2500 mg | Freq: Once | INTRAMUSCULAR | Status: DC | PRN

## 2018-01-25 MED ORDER — ONDANSETRON HCL 4 MG/2ML IJ SOLN: 4.0000 mg | Freq: Four times a day (QID) | INTRAMUSCULAR | Status: DC | PRN

## 2018-01-25 MED ORDER — MISOPROSTOL 25 MCG QUARTER TABLET: 25.0000 ug | ORAL_TABLET | ORAL | Status: DC | PRN

## 2018-01-25 MED ORDER — LACTATED RINGERS IV SOLN
500.0000 mL | Freq: Once | INTRAVENOUS | Status: AC
  Administered 2018-01-25: 500 mL via INTRAVENOUS

## 2018-01-25 MED ORDER — MISOPROSTOL 25 MCG QUARTER TABLET
25.0000 ug | ORAL_TABLET | ORAL | Status: DC
  Administered 2018-01-25 (×2): 25 ug via BUCCAL
  Filled 2018-01-25 (×2): qty 1

## 2018-01-25 MED ORDER — LIDOCAINE-EPINEPHRINE (PF) 1.5 %-1:200000 IJ SOLN
INTRAMUSCULAR | Status: DC | PRN
  Administered 2018-01-25: 3 mL

## 2018-01-25 MED ORDER — SODIUM CHLORIDE 0.9 % IV SOLN
INTRAVENOUS | Status: DC | PRN
  Administered 2018-01-25 (×3): 5 mL via EPIDURAL

## 2018-01-25 MED ORDER — LACTATED RINGERS IV SOLN
500.0000 mL | INTRAVENOUS | Status: DC | PRN
  Administered 2018-01-25 (×2): 500 mL via INTRAVENOUS

## 2018-01-25 MED ORDER — FENTANYL 2.5 MCG/ML W/ROPIVACAINE 0.15% IN NS 100 ML EPIDURAL (ARMC)
12.0000 mL/h | EPIDURAL | Status: DC
  Administered 2018-01-25 – 2018-01-26 (×2): 12 mL/h via EPIDURAL
  Filled 2018-01-25: qty 100

## 2018-01-25 MED ORDER — LIDOCAINE HCL (PF) 1 % IJ SOLN: 30.0000 mL | INTRAMUSCULAR | Status: DC | PRN

## 2018-01-25 MED ORDER — LACTATED RINGERS IV SOLN
INTRAVENOUS | Status: DC
  Administered 2018-01-25 – 2018-01-26 (×4): via INTRAVENOUS

## 2018-01-25 MED ORDER — CEFAZOLIN SODIUM-DEXTROSE 2-4 GM/100ML-% IV SOLN
2.0000 g | Freq: Once | INTRAVENOUS | Status: DC
  Filled 2018-01-25: qty 100

## 2018-01-25 NOTE — Progress Notes (Signed)
RN called provider to verify order to initiate IV pitocin 2 after giving patient buccal cytotec. Verbal order from MD to initiate pitocin.

## 2018-01-25 NOTE — Progress Notes (Signed)
Intrapartum progress note:  S:  Comfortable, feeling some contractions  No complaints Denies: HA, visual changes, SOB, or RUQ/epigastric pain   O: BP 135/80   Pulse 76   Temp 98.4 F (36.9 C) (Oral)   Resp 16   Ht 5\' 5"  (1.651 m)   Wt 101.2 kg (223 lb)   LMP 06/06/2017 Comment: periods are irregular, not preg  SpO2 100%   BMI 37.11 kg/m   FHT: 150 moderate, +accelerations, no decels TOCO: q4-725min SVE: 4/70/-2, AROM'd for clear fluid  A/P: 36yo G1P0 @ 37+0 with IOL for gestational hypertension  1. GHTN: normotensive, no s/sx preeclampsia 2. IUP: category 1 tracing 3. IOL: s/p cytotec x1, both buccal and vaginal 25mcg.  AROM, re-dose buccal cytotec x1 now and reassess in 2 hrs. 4. Currently treatment for UTI: PO keflex, continue 5. Continue active management.  ----- Ranae Plumberhelsea Ward, MD Attending Obstetrician and Gynecologist [facility], Department of OB/GYN [facility]

## 2018-01-25 NOTE — Progress Notes (Deleted)
Provider notified of Reactive NST and discharge instructions recieved. Pt will return on Friday 01/29/18 for another NST before her scheduled C/S.

## 2018-01-25 NOTE — Anesthesia Procedure Notes (Signed)
Epidural Patient location during procedure: OB Start time: 01/25/2018 7:52 PM End time: 01/25/2018 8:11 PM  Staffing Anesthesiologist: Alver FisherPenwarden, Gianno Volner, MD Performed: anesthesiologist   Preanesthetic Checklist Completed: patient identified, site marked, surgical consent, pre-op evaluation, timeout performed, IV checked, risks and benefits discussed and monitors and equipment checked  Epidural Patient position: sitting Prep: ChloraPrep Patient monitoring: heart rate, continuous pulse ox and blood pressure Approach: midline Location: L3-L4 Injection technique: LOR saline  Needle:  Needle type: Tuohy  Needle gauge: 18 G Needle length: 9 cm and 9 Needle insertion depth: 6.5 cm Catheter type: closed end flexible Catheter size: 20 Guage Catheter at skin depth: 11 cm Test dose: negative (0.125% bupivacaine)  Assessment Events: blood not aspirated, injection not painful, no injection resistance, negative IV test and no paresthesia  Additional Notes   Patient tolerated the insertion well without complications.Reason for block:procedure for pain

## 2018-01-25 NOTE — Anesthesia Preprocedure Evaluation (Signed)
Anesthesia Evaluation  Patient identified by MRN, date of birth, ID band Patient awake    Reviewed: Allergy & Precautions, NPO status , Patient's Chart, lab work & pertinent test results  History of Anesthesia Complications Negative for: history of anesthetic complications  Airway Mallampati: III  TM Distance: >3 FB Neck ROM: Full    Dental  (+) Poor Dentition   Pulmonary neg sleep apnea, neg COPD, former smoker,    breath sounds clear to auscultation- rhonchi (-) wheezing      Cardiovascular Exercise Tolerance: Good hypertension (gHTN, no meds), (-) CAD and (-) Past MI  Rhythm:Regular Rate:Normal - Systolic murmurs and - Diastolic murmurs    Neuro/Psych  Headaches, negative psych ROS   GI/Hepatic negative GI ROS, Neg liver ROS,   Endo/Other  negative endocrine ROSneg diabetes  Renal/GU negative Renal ROS     Musculoskeletal negative musculoskeletal ROS (+)   Abdominal (+) + obese, Gravid abdomen  Peds  Hematology negative hematology ROS (+)   Anesthesia Other Findings Past Medical History: No date: Joint pain No date: Migraines  IOL for gHTN  Reproductive/Obstetrics (+) Pregnancy                             Lab Results  Component Value Date   WBC 8.0 01/25/2018   HGB 12.7 01/25/2018   HCT 36.9 01/25/2018   MCV 88.6 01/25/2018   PLT 146 (L) 01/25/2018    Anesthesia Physical Anesthesia Plan  ASA: II  Anesthesia Plan: Epidural   Post-op Pain Management:    Induction:   PONV Risk Score and Plan: 2  Airway Management Planned: Natural Airway  Additional Equipment:   Intra-op Plan:   Post-operative Plan:   Informed Consent: I have reviewed the patients History and Physical, chart, labs and discussed the procedure including the risks, benefits and alternatives for the proposed anesthesia with the patient or authorized representative who has indicated his/her  understanding and acceptance.     Plan Discussed with: CRNA and Anesthesiologist  Anesthesia Plan Comments: (Plan for epidural for labor, discussed epidural vs spinal vs GA if need for csection)        Anesthesia Quick Evaluation

## 2018-01-25 NOTE — H&P (Signed)
OB History & Physical   History of Present Illness:  Chief Complaint:   HPI:  Theresa Davidson is a 37 y.o. G1P0 female at [redacted]w[redacted]d dated by  Patient's last menstrual period was 06/06/2017. Estimated Date of Delivery: 02/15/18  She presents to L&D for scheduled IOL for gestational hypertension  +FM, no CTX, no LOF, no VB Denies: HA, visual changes, SOB, or RUQ/epigastric pain   Pregnancy Issues: 1. Gestational hypertension - Dx'd this past week 2. AMA, 36y 3. Late to care @ 17wks 4. Elevated MSAFP, normal workup 5. UTIs, on keflex QID currently day 4/7  Maternal Medical History:   Past Medical History:  Diagnosis Date  . Joint pain   . Migraines     History reviewed. No pertinent surgical history.  Allergies  Allergen Reactions  . Penicillins Other (See Comments)    Doesn't know symptoms last had it as a child     Prior to Admission medications   Medication Sig Start Date End Date Taking? Authorizing Provider  acetaminophen (TYLENOL) 325 MG tablet Take 650 mg by mouth every 6 (six) hours as needed.   Yes [provider]  cephALEXin (KEFLEX) 500 MG capsule Take 1 capsule (500 mg total) by mouth 4 (four) times daily for 10 days. 01/21/18 01/31/18 Yes McVey, Prudencio Pair, CNM  Prenatal Vit-Fe Fumarate-FA (PRENATAL MULTIVITAMIN) TABS tablet Take 1 tablet by mouth daily at 12 noon.   Yes [provider]     Prenatal care [facility]  Social History: She  reports that she has quit smoking. She has never used smokeless tobacco. She reports that she drank alcohol. She reports that she does not use drugs.  Family History: family history is not on file.   Review of Systems: A full review of systems was performed and negative except as noted in the HPI.     Physical Exam:  Vital Signs: BP (!) 149/84 (BP Location: Left Arm)   Pulse 84   Temp 98.4 F (36.9 C) (Oral)   Resp 18   Ht 5\' 5"  (1.651 m)   Wt 101.2 kg (223 lb)   LMP 06/06/2017 Comment:  periods are irregular, not preg  SpO2 100%   BMI 37.11 kg/m  General: no acute distress.  HEENT: normocephalic, atraumatic Heart: regular rate & rhythm.  No murmurs/rubs/gallops Lungs: clear to auscultation bilaterally, normal respiratory effort Abdomen: soft, gravid, non-tender;  EFW: 7.0 Pelvic:   External: Normal external female genitalia  Cervix: 3/60/-2   Extremities: non-tender, symmetric, mild edema bilaterally.  DTRs: 2+ Neurologic: Alert & oriented x 3.    Results for orders placed or performed during the hospital encounter of 01/25/18 (from the past 24 hour(s))  CBC     Status: Abnormal   Collection Time: 01/25/18  8:40 AM  Result Value Ref Range   WBC 8.0 3.6 - 11.0 K/uL   RBC 4.17 3.80 - 5.20 MIL/uL   Hemoglobin 12.7 12.0 - 16.0 g/dL   HCT 16.1 09.6 - 04.5 %   MCV 88.6 80.0 - 100.0 fL   MCH 30.5 26.0 - 34.0 pg   MCHC 34.4 32.0 - 36.0 g/dL   RDW 40.9 81.1 - 91.4 %   Platelets 146 (L) 150 - 440 K/uL  Comprehensive metabolic panel     Status: Abnormal   Collection Time: 01/25/18  8:40 AM  Result Value Ref Range   Sodium 136 135 - 145 mmol/L   Potassium 3.5 3.5 - 5.1 mmol/L   Chloride  105 98 - 111 mmol/L   CO2 21 (L) 22 - 32 mmol/L   Glucose, Bld 135 (H) 70 - 99 mg/dL   BUN 8 6 - 20 mg/dL   Creatinine, Ser 1.610.58 0.44 - 1.00 mg/dL   Calcium 8.5 (L) 8.9 - 10.3 mg/dL   Total Protein 6.2 (L) 6.5 - 8.1 g/dL   Albumin 2.8 (L) 3.5 - 5.0 g/dL   AST 29 15 - 41 U/L   ALT 14 0 - 44 U/L   Alkaline Phosphatase 165 (H) 38 - 126 U/L   Total Bilirubin 0.7 0.3 - 1.2 mg/dL   GFR calc non Af Amer >60 >60 mL/min   GFR calc Af Amer >60 >60 mL/min   Anion gap 10 5 - 15  Protein / creatinine ratio, urine     Status: None   Collection Time: 01/25/18  8:40 AM  Result Value Ref Range   Creatinine, Urine 120 mg/dL   Total Protein, Urine 12 mg/dL   Protein Creatinine Ratio 0.10 0.00 - 0.15 mg/mg[Cre]  Type and screen     Status: None   Collection Time: 01/25/18  8:40 AM  Result  Value Ref Range   ABO/RH(D) O POS    Antibody Screen NEG    Sample Expiration      01/28/2018 Performed at [facility], [address] [place], [place] [address]   Urinalysis, Routine w reflex microscopic     Status: Abnormal   Collection Time: 01/25/18  8:40 AM  Result Value Ref Range   Color, Urine YELLOW (A) YELLOW   APPearance CLEAR (A) CLEAR   Specific Gravity, Urine 1.013 1.005 - 1.030   pH 6.0 5.0 - 8.0   Glucose, UA NEGATIVE NEGATIVE mg/dL   Hgb urine dipstick MODERATE (A) NEGATIVE   Bilirubin Urine NEGATIVE NEGATIVE   Ketones, ur 5 (A) NEGATIVE mg/dL   Protein, ur NEGATIVE NEGATIVE mg/dL   Nitrite NEGATIVE NEGATIVE   Leukocytes, UA NEGATIVE NEGATIVE   RBC / HPF 0-5 0 - 5 RBC/hpf   WBC, UA 0-5 0 - 5 WBC/hpf   Bacteria, UA NONE SEEN NONE SEEN   Squamous Epithelial / LPF 6-10 0 - 5   Mucus PRESENT    Non Squamous Epithelial PRESENT (A) NONE SEEN    Pertinent Results:  Prenatal Labs: Blood type/Rh O+  Antibody screen neg  Rubella Immune  Varicella Immune  RPR NR  HBsAg Neg  HIV NR  GC neg  Chlamydia neg  Genetic screening negative  1 hour GTT 142  3 hour GTT [phone]  GBS Neg 01/21/18   FHT: 150 mod + accels no decels TOCO: occasional SVE:  3/60/-2   Cephalic by leopolds   Assessment:  Theresa Davidson is a 37 y.o. G1P0 female at 4722w0d with IOL for GHTN.   Plan:  1. Admit to Labor & Delivery 2. CBC, T&S, Clrs, IVF 3. GBS  Neg - no abx indicated 4. Consents obtained. 5. Continuous efm/toco 6. IOL: cytotec then AROM 7. May continue keflex for dx'd UTI.  I cannot see a culture with the labs available, not sure if one was run.    ----- Ranae Plumberhelsea Ward, MD Attending Obstetrician and Gynecologist [facility], Department of OB/GYN [facility]

## 2018-01-26 LAB — PROTEIN / CREATININE RATIO, URINE
Creatinine, Urine: 53 mg/dL
Protein Creatinine Ratio: 0.53 mg/mg{Cre} — ABNORMAL HIGH (ref 0.00–0.15)
TOTAL PROTEIN, URINE: 28 mg/dL

## 2018-01-26 LAB — RPR: RPR: NONREACTIVE

## 2018-01-26 MED ORDER — BENZOCAINE-MENTHOL 20-0.5 % EX AERO: 1.0000 "application " | INHALATION_SPRAY | CUTANEOUS | Status: DC | PRN

## 2018-01-26 MED ORDER — SIMETHICONE 80 MG PO CHEW
160.0000 mg | CHEWABLE_TABLET | Freq: Four times a day (QID) | ORAL | Status: DC
  Administered 2018-01-26 – 2018-01-28 (×5): 160 mg via ORAL
  Filled 2018-01-26 (×5): qty 2

## 2018-01-26 MED ORDER — IBUPROFEN 600 MG PO TABS
600.0000 mg | ORAL_TABLET | Freq: Four times a day (QID) | ORAL | Status: DC
  Administered 2018-01-26 – 2018-01-28 (×7): 600 mg via ORAL
  Filled 2018-01-26 (×7): qty 1

## 2018-01-26 MED ORDER — PRENATAL MULTIVITAMIN CH
1.0000 | ORAL_TABLET | Freq: Every day | ORAL | Status: DC
  Administered 2018-01-26 – 2018-01-28 (×3): 1 via ORAL
  Filled 2018-01-26 (×3): qty 1

## 2018-01-26 MED ORDER — DIPHENHYDRAMINE HCL 25 MG PO CAPS: 25.0000 mg | ORAL_CAPSULE | Freq: Four times a day (QID) | ORAL | Status: DC | PRN

## 2018-01-26 MED ORDER — SENNOSIDES-DOCUSATE SODIUM 8.6-50 MG PO TABS
2.0000 | ORAL_TABLET | ORAL | Status: DC
  Administered 2018-01-27: 2 via ORAL
  Filled 2018-01-26 (×3): qty 2

## 2018-01-26 MED ORDER — WITCH HAZEL-GLYCERIN EX PADS: 1.0000 "application " | MEDICATED_PAD | CUTANEOUS | Status: DC | PRN

## 2018-01-26 MED ORDER — COCONUT OIL OIL: 1.0000 "application " | TOPICAL_OIL | Status: DC | PRN

## 2018-01-26 MED ORDER — ONDANSETRON HCL 4 MG PO TABS: 4.0000 mg | ORAL_TABLET | ORAL | Status: DC | PRN

## 2018-01-26 MED ORDER — ONDANSETRON HCL 4 MG/2ML IJ SOLN: 4.0000 mg | INTRAMUSCULAR | Status: DC | PRN

## 2018-01-26 MED ORDER — DIBUCAINE 1 % RE OINT: 1.0000 "application " | TOPICAL_OINTMENT | RECTAL | Status: DC | PRN

## 2018-01-26 MED ORDER — IBUPROFEN 600 MG PO TABS
600.0000 mg | ORAL_TABLET | Freq: Four times a day (QID) | ORAL | Status: DC
  Administered 2018-01-26: 600 mg via ORAL
  Filled 2018-01-26: qty 1

## 2018-01-26 MED ORDER — ACETAMINOPHEN 325 MG PO TABS: 650.0000 mg | ORAL_TABLET | ORAL | Status: DC | PRN

## 2018-01-26 NOTE — Discharge Summary (Signed)
Obstetric Discharge Summary   Patient ID: Patient Name: Theresa Davidson DOB: 1980-09-18 MRN: [id number]  Date of Admission: 01/25/2018 Date of Delivery: 01/26/2018 Delivered by: Genia Del, CNM Date of Discharge: 01/28/2018  Primary OB: [facility] WUJ:[id number] last menstrual period was 06/06/2017. EDC Estimated Date of Delivery: 02/15/18 Gestational Age at Delivery: [redacted]w[redacted]d   Antepartum complications:  1. Gestational hypertension 2. AMA, 36y 3. Late to care @ 17wks 4. Elevated MSAFP, normal workup 5. UTIs, on Keflex QID currently day 4/7  Admitting Diagnosis: planned IOL due to gHTN  Secondary Diagnoses: Patient Active Problem List   Diagnosis Date Noted  . Gestational hypertension w/o significant proteinuria in 3rd trimester 01/25/2018  . Gestational hypertension, antepartum 01/23/2018  . Elevated BP without diagnosis of hypertension 01/21/2018  . Pregnancy 11/27/2017  . Abnormal maternal serum screening test   . Advanced maternal age, primigravida in third trimester, antepartum     Induction & Augmentation: AROM, Pitocin and Cytotec Complications: None Intrapartum complications/course: Theresa Davidson presented to L&D for IOL due to gHTN. She was induced with misoprostol, then augmented with AROM and pitocin. Epidural placed for pain relief. She slowly progressed to complete, with pitocin paused multiple times for periods of decelerations. She had a spontaneous vaginal birth of a live female over an intact perineum. The fetal head was delivered in direct OA position with restitution to LOA. No nuchal cord. Anterior then posterior shoulders delivered spontaneously. Baby placed on mom's abdomen and attended to by transition RN. Cord clamped after 3-4 minute delay by aunt of the baby. Cord blood obtained for newborn labs.  Delivery Type: spontaneous vaginal delivery Anesthesia: epidural Placenta: sponatneous Laceration: B/l labial and 1st degree left vaginal wall, left  labial and left vaginal wall repaired, right labial hemostatic Episiotomy: none  Newborn Data: Live born female "Elijah" Birth Weight: 6 lb 9.8 oz (3000 g) APGAR: 9, 9  Newborn Delivery   Birth date/time:  01/26/2018 12:44:00 Delivery type:  Vaginal, Spontaneous      Postpartum Course  Patient had an uncomplicated postpartum course.  By time of discharge on PPD#2, her pain was controlled on oral pain medications; she had appropriate lochia and was ambulating, voiding without difficulty and tolerating regular diet.  She was deemed stable for discharge to home. However, her infant was being watched in the SCN for elevated bilirubin, and she elected to room in overnight with her infant on day of discharge.      Labs: CBC Latest Ref Rng & Units 01/27/2018 01/25/2018 01/21/2018  WBC 3.6 - 11.0 K/uL 12.5(H) 8.0 9.3  Hemoglobin 12.0 - 16.0 g/dL 11.8(L) 12.7 12.3  Hematocrit 35.0 - 47.0 % 34.6(L) 36.9 36.0  Platelets 150 - 440 K/uL 145(L) 146(L) 143(L)   O POS  Physical exam:  BP 118/71 (BP Location: Right Arm)   Pulse 80   Temp 98 F (36.7 C)   Resp 18   Ht 5\' 5"  (1.651 m)   Wt 101.2 kg   LMP 06/06/2017 Comment: periods are irregular, not preg  SpO2 99%   Breastfeeding? Unknown   BMI 37.11 kg/m  General: alert and no distress Pulm: normal respiratory effort Lochia: appropriate Abdomen: soft, NT Uterine Fundus: firm, below umbilicus Extremities: No evidence of DVT seen on physical exam. No lower extremity edema.  Disposition: stable, discharge to home Baby Feeding: formula Baby Disposition: home with mom  Contraception: nexplanon at 6 weeks  Prenatal Labs:  Blood type/Rh O+  Antibody screen neg  Rubella Immune  Varicella Immune  RPR NR  HBsAg Neg  HIV NR  GC neg  Chlamydia neg  Genetic screening negative  1 hour GTT 142  3 hour GTT [phone],[phone]  GBS Neg 01/21/18   Rh Immune globulin given: n/a Rubella vaccine given: n/a Tdap vaccine given in AP or PP setting: AP  11/30/17 Flu vaccine given in AP or PP setting: AP 04/2017  Plan:  Theresa Davidson was discharged to home in good condition. [facility] in 6 weeks  Discharge Instructions: Per After Visit Summary. Activity: Advance as tolerated. Pelvic rest for 6 weeks.   Diet: Regular Discharge Medications: Allergies as of 01/28/2018      Reactions   Penicillins Other (See Comments)   Doesn't know symptoms last had it as a child       Medication List    STOP taking these medications   cephALEXin 500 MG capsule Commonly known as:  KEFLEX     TAKE these medications   acetaminophen 325 MG tablet Commonly known as:  TYLENOL Take 650 mg by mouth every 6 (six) hours as needed. What changed:  Another medication with the same name was added. Make sure you understand how and when to take each.   acetaminophen 325 MG tablet Commonly known as:  TYLENOL Take 2 tablets (650 mg total) by mouth every 4 (four) hours as needed (for pain scale < 4). What changed:  You were already taking a medication with the same name, and this prescription was added. Make sure you understand how and when to take each.   ibuprofen 600 MG tablet Commonly known as:  ADVIL,MOTRIN Take 1 tablet (600 mg total) by mouth every 6 (six) hours.   prenatal multivitamin Tabs tablet Take 1 tablet by mouth daily at 12 noon.      Outpatient follow up:    Signed:  Christeen DouglasBethany Kayleeann Huxford 01/28/18

## 2018-01-26 NOTE — Progress Notes (Signed)
Labor Progress Note  Theresa Davidson is a 37 y.o. G1P0 at 5331w1d by LMP c/w 4564w2d ultrasound admitted for induction of labor due to gestational hypertension.  Subjective:  Comfortable with epidural in place, in high fowler's position.   Objective: BP 134/84   Pulse 87   Temp 97.9 F (36.6 C) (Oral)   Resp 17   Ht 5\' 5"  (1.651 m)   Wt 101.2 kg (223 lb)   LMP 06/06/2017 Comment: periods are irregular, not preg  SpO2 100%   BMI 37.11 kg/m  Notable VS details: normal to mild range BPs  Fetal Assessment: FHT:  FHR: 150 bpm, variability: moderate,  accelerations:  Present,  decelerations:  Present variable, late Category/reactivity:  Category II UC: regular, every 2-4 minutes SVE:    Dilation: 10cm  Effacement: 100%  Station:  0  Consistency: soft  Position: anterior  Membrane status: AROM 01/25/18 1400 Amniotic color: clear  Labs: Lab Results  Component Value Date   WBC 8.0 01/25/2018   HGB 12.7 01/25/2018   HCT 36.9 01/25/2018   MCV 88.6 01/25/2018   PLT 146 (L) 01/25/2018    Assessment / Plan: Induction of labor due to gestational hypertension, protracted active phase  Labor: Slow progression, currently on 8824mu/min Pitocin. Cervical lip reduced.  Gestational Hypertension:  no signs or symptoms of preeclampsia Fetal Wellbeing:  Category II for non-recurrent variable and late decels, overall reassuring with moderate variability and regular accels Pain Control:  Epidural Anticipated MOD:  NSVD   Cervical lip reduced. Plan to start pushing with RN, who will check to ensure that cervix is remaining completely dilated without lip.    Genia DelMargaret Teancum Brule, CNM 01/26/2018, 10:18 AM

## 2018-01-26 NOTE — Progress Notes (Signed)
Labor Progress Note  Theresa Davidson is a 37 y.o. G1P0 at 2669w1d by LMP c/w 1273w2d ultrasound admitted for induction of labor due to gestational hypertension.  Subjective:  Comfortable with epidural in place, in right side-lying position.   Objective: BP 112/77 (BP Location: Left Arm)   Pulse 96   Temp 98.2 F (36.8 C) (Oral)   Resp 17   Ht 5\' 5"  (1.651 m)   Wt 101.2 kg (223 lb)   LMP 06/06/2017 Comment: periods are irregular, not preg  SpO2 100%   BMI 37.11 kg/m  Notable VS details: normal to mild range BPs  Fetal Assessment: FHT:  FHR: 150 bpm, variability: moderate,  accelerations:  Present,  decelerations:  Present variable, late Category/reactivity:  Category II UC: regular, every 2-4 minutes SVE:    Dilation: 9.5cm, with anterior/right sided lip  Effacement: 90%  Station:  0  Consistency: soft  Position: anterior  Membrane status: AROM 01/25/18 1400 Amniotic color: clear  Labs: Lab Results  Component Value Date   WBC 8.0 01/25/2018   HGB 12.7 01/25/2018   HCT 36.9 01/25/2018   MCV 88.6 01/25/2018   PLT 146 (L) 01/25/2018    Assessment / Plan: Induction of labor due to gestational hypertension, protracted active phase  Labor: Slow progression, currently on 2320mu/min Pitocin, continue to titrate per protocol Gestational Hypertension:  no signs or symptoms of preeclampsia Fetal Wellbeing:  Category II for non-recurrent variable and late decels, overall reassuring with moderate variability and regular accels Pain Control:  Epidural Anticipated MOD:  NSVD  In-person hand-off with off-going provider Dr. Elesa MassedWard, who is aware of FHT tracing, most recent cervical exam, and plan.   Genia DelMargaret Eladia Frame, CNM 01/26/2018, 8:26 AM

## 2018-01-27 LAB — CBC
HCT: 34.6 % — ABNORMAL LOW (ref 35.0–47.0)
Hemoglobin: 11.8 g/dL — ABNORMAL LOW (ref 12.0–16.0)
MCH: 30.3 pg (ref 26.0–34.0)
MCHC: 34.2 g/dL (ref 32.0–36.0)
MCV: 88.5 fL (ref 80.0–100.0)
PLATELETS: 145 10*3/uL — AB (ref 150–440)
RBC: 3.91 MIL/uL (ref 3.80–5.20)
RDW: 13.9 % (ref 11.5–14.5)
WBC: 12.5 10*3/uL — ABNORMAL HIGH (ref 3.6–11.0)

## 2018-01-27 LAB — COMPREHENSIVE METABOLIC PANEL
ALT: 14 U/L (ref 0–44)
ANION GAP: 5 (ref 5–15)
AST: 24 U/L (ref 15–41)
Albumin: 2.2 g/dL — ABNORMAL LOW (ref 3.5–5.0)
Alkaline Phosphatase: 138 U/L — ABNORMAL HIGH (ref 38–126)
BUN: 8 mg/dL (ref 6–20)
CALCIUM: 8.1 mg/dL — AB (ref 8.9–10.3)
CHLORIDE: 112 mmol/L — AB (ref 98–111)
CO2: 23 mmol/L (ref 22–32)
Creatinine, Ser: 0.43 mg/dL — ABNORMAL LOW (ref 0.44–1.00)
Glucose, Bld: 85 mg/dL (ref 70–99)
Potassium: 3.8 mmol/L (ref 3.5–5.1)
SODIUM: 140 mmol/L (ref 135–145)
Total Bilirubin: 0.6 mg/dL (ref 0.3–1.2)
Total Protein: 5.3 g/dL — ABNORMAL LOW (ref 6.5–8.1)

## 2018-01-27 NOTE — Progress Notes (Signed)
Post Partum Day 1 Subjective: Doing well, no complaints.  Tolerating regular diet, pain with PO meds, voiding and ambulating without difficulty.  No CP SOB F/C N/V or leg pain no HA change of vision, RUQ/epigastric pain  Objective: BP 135/68 (BP Location: Right Arm)   Pulse 68   Temp 98.7 F (37.1 C) (Oral)   Resp 18   Ht 5\' 5"  (1.651 m)   Wt 101.2 kg (223 lb)   LMP 06/06/2017 Comment: periods are irregular, not preg  SpO2 97%   Breastfeeding? Unknown   BMI 37.11 kg/m    Physical Exam:  General: NAD CV: RRR Pulm: nl effort, CTABL Lochia: moderate Uterine Fundus: fundus firm and below umbilicus DVT Evaluation: no cords, ttp LEs   Recent Labs    01/25/18 0840 01/27/18 0516  HGB 12.7 11.8*  HCT 36.9 34.6*  WBC 8.0 12.5*  PLT 146* 145*    Assessment/Plan: 36 y.o. G1P1001 postpartum day # 1  1. Doing well, no issues thus far 2. Formula feeding 3. Normal to mild-range BPs, no s/sx preeclampsia 4. Continue inpatient admission    ----- Ranae Plumberhelsea Ward, MD Attending Obstetrician and Gynecologist [facility]

## 2018-01-27 NOTE — Anesthesia Postprocedure Evaluation (Signed)
Anesthesia Post Note  Patient: Theresa Davidson  Procedure(s) Performed: AN AD HOC LABOR EPIDURAL  Patient location during evaluation: Mother Baby Anesthesia Type: Epidural Level of consciousness: awake, awake and alert and oriented Pain management: pain level controlled Vital Signs Assessment: post-procedure vital signs reviewed and stable Respiratory status: spontaneous breathing Cardiovascular status: blood pressure returned to baseline Postop Assessment: no headache, no backache and able to ambulate Anesthetic complications: no     Last Vitals:  Vitals:   01/26/18 2324 01/27/18 0439  BP: 129/73 130/66  Pulse: 68 70  Resp: 18 18  Temp: 36.8 C 36.5 C  SpO2: 99% 99%    Last Pain:  Vitals:   01/27/18 0512  TempSrc:   PainSc: 3                  Jere Bostrom Lawerance CruelStarr

## 2018-01-28 MED ORDER — IBUPROFEN 600 MG PO TABS: 600.0000 mg | ORAL_TABLET | Freq: Four times a day (QID) | ORAL | 0 refills | Status: DC

## 2018-01-28 MED ORDER — ACETAMINOPHEN 325 MG PO TABS: 650.0000 mg | ORAL_TABLET | ORAL | 0 refills | Status: DC | PRN

## 2018-01-28 NOTE — Progress Notes (Signed)
Reviewed all patients discharge instructions and handouts regarding postpartum bleeding, no intercourse for 6 weeks, signs and symptoms of mastitis and postpartum bleu's. questions have been answered at this time. Patient discharged via wheelchair with axillary.

## 2018-03-10 DIAGNOSIS — G5603 Carpal tunnel syndrome, bilateral upper limbs: Secondary | ICD-10-CM | POA: Insufficient documentation

## 2018-06-30 ENCOUNTER — Ambulatory Visit: Payer: Commercial Managed Care - PPO | Admitting: Anesthesiology

## 2018-06-30 ENCOUNTER — Ambulatory Visit
Admission: RE | Admit: 2018-06-30 | Discharge: 2018-06-30 | Disposition: A | Discharge status: Home / Self Care | Payer: Commercial Managed Care - PPO | Attending: Surgery | Admitting: Surgery

## 2018-06-30 ENCOUNTER — Encounter
Admission: RE | Disposition: A | Discharge status: Home / Self Care | Payer: Self-pay | Source: Home / Self Care | Attending: Surgery

## 2018-06-30 DIAGNOSIS — R51 Headache: Secondary | ICD-10-CM | POA: Insufficient documentation

## 2018-06-30 DIAGNOSIS — I10 Essential (primary) hypertension: Secondary | ICD-10-CM | POA: Diagnosis not present

## 2018-06-30 DIAGNOSIS — Z79899 Other long term (current) drug therapy: Secondary | ICD-10-CM | POA: Diagnosis not present

## 2018-06-30 DIAGNOSIS — G5601 Carpal tunnel syndrome, right upper limb: Secondary | ICD-10-CM | POA: Insufficient documentation

## 2018-06-30 HISTORY — DX: Essential (primary) hypertension: I10

## 2018-06-30 HISTORY — PX: CARPAL TUNNEL RELEASE: SHX101

## 2018-06-30 SURGERY — RELEASE, CARPAL TUNNEL, ENDOSCOPIC
Anesthesia: Regional | Site: Wrist | Laterality: Right

## 2018-06-30 MED ORDER — LACTATED RINGERS IV SOLN
INTRAVENOUS | Status: DC
  Administered 2018-06-30: 11:00:00 via INTRAVENOUS

## 2018-06-30 MED ORDER — BUPIVACAINE HCL (PF) 0.5 % IJ SOLN
INTRAMUSCULAR | Status: DC | PRN
  Administered 2018-06-30: 10 mL

## 2018-06-30 MED ORDER — LACTATED RINGERS IV SOLN: INTRAVENOUS | Status: DC

## 2018-06-30 MED ORDER — ACETAMINOPHEN 10 MG/ML IV SOLN: 1000.0000 mg | Freq: Once | INTRAVENOUS | Status: DC | PRN

## 2018-06-30 MED ORDER — PROPOFOL 500 MG/50ML IV EMUL
INTRAVENOUS | Status: DC | PRN
  Administered 2018-06-30: 100 ug/kg/min via INTRAVENOUS

## 2018-06-30 MED ORDER — SODIUM CHLORIDE 0.9 % IV SOLN
900.0000 mg | Freq: Once | INTRAVENOUS | Status: AC
  Administered 2018-06-30: 900 mg via INTRAVENOUS

## 2018-06-30 MED ORDER — ONDANSETRON HCL 4 MG/2ML IJ SOLN: 4.0000 mg | Freq: Once | INTRAMUSCULAR | Status: DC | PRN

## 2018-06-30 MED ORDER — FENTANYL CITRATE (PF) 100 MCG/2ML IJ SOLN: 25.0000 ug | INTRAMUSCULAR | Status: DC | PRN

## 2018-06-30 MED ORDER — ONDANSETRON HCL 4 MG/2ML IJ SOLN
INTRAMUSCULAR | Status: DC | PRN
  Administered 2018-06-30: 4 mg via INTRAVENOUS

## 2018-06-30 MED ORDER — LIDOCAINE HCL (PF) 0.5 % IJ SOLN
INTRAMUSCULAR | Status: DC | PRN
  Administered 2018-06-30: 40 mL via INTRAVENOUS

## 2018-06-30 MED ORDER — OXYCODONE HCL 5 MG PO TABS: 5.0000 mg | ORAL_TABLET | Freq: Once | ORAL | Status: DC | PRN

## 2018-06-30 MED ORDER — OXYCODONE HCL 5 MG/5ML PO SOLN: 5.0000 mg | Freq: Once | ORAL | Status: DC | PRN

## 2018-06-30 MED ORDER — HYDROCODONE-ACETAMINOPHEN 5-325 MG PO TABS: 1.0000 | ORAL_TABLET | Freq: Four times a day (QID) | ORAL | 0 refills | Status: DC | PRN

## 2018-06-30 MED ORDER — LIDOCAINE HCL (CARDIAC) PF 100 MG/5ML IV SOSY
PREFILLED_SYRINGE | INTRAVENOUS | Status: DC | PRN
  Administered 2018-06-30: 30 mg via INTRAVENOUS

## 2018-06-30 MED ORDER — FENTANYL CITRATE (PF) 100 MCG/2ML IJ SOLN
INTRAMUSCULAR | Status: DC | PRN
  Administered 2018-06-30 (×2): 50 ug via INTRAVENOUS

## 2018-06-30 MED ORDER — MIDAZOLAM HCL 5 MG/5ML IJ SOLN
INTRAMUSCULAR | Status: DC | PRN
  Administered 2018-06-30: 2 mg via INTRAVENOUS

## 2018-06-30 SURGICAL SUPPLY — 25 items
BANDAGE ELASTIC 2 LF NS (GAUZE/BANDAGES/DRESSINGS) ×3 IMPLANT
BNDG COHESIVE 4X5 TAN STRL (GAUZE/BANDAGES/DRESSINGS) ×3 IMPLANT
BNDG ESMARK 4X12 TAN STRL LF (GAUZE/BANDAGES/DRESSINGS) ×3 IMPLANT
CHLORAPREP W/TINT 26ML (MISCELLANEOUS) ×3 IMPLANT
CORD BIP STRL DISP 12FT (MISCELLANEOUS) ×3 IMPLANT
COVER LIGHT HANDLE UNIVERSAL (MISCELLANEOUS) ×6 IMPLANT
CUFF TOURNIQUET DUAL PORT 18X3 (MISCELLANEOUS) ×3 IMPLANT
DRAPE SURG 17X11 SM STRL (DRAPES) ×3 IMPLANT
GAUZE PETRO XEROFOAM 1X8 (MISCELLANEOUS) ×3 IMPLANT
GAUZE SPONGE 4X4 12PLY STRL (GAUZE/BANDAGES/DRESSINGS) ×3 IMPLANT
GLOVE BIO SURGEON STRL SZ8 (GLOVE) ×3 IMPLANT
GLOVE INDICATOR 8.0 STRL GRN (GLOVE) ×3 IMPLANT
GOWN STRL REUS W/ TWL LRG LVL3 (GOWN DISPOSABLE) ×1 IMPLANT
GOWN STRL REUS W/ TWL XL LVL3 (GOWN DISPOSABLE) ×1 IMPLANT
GOWN STRL REUS W/TWL LRG LVL3 (GOWN DISPOSABLE) ×2
GOWN STRL REUS W/TWL XL LVL3 (GOWN DISPOSABLE) ×2
KIT CARPAL TUNNEL (MISCELLANEOUS) ×2
KIT ESCP INSRT D SLOT CANN KN (MISCELLANEOUS) ×1 IMPLANT
KIT TURNOVER KIT A (KITS) ×3 IMPLANT
NS IRRIG 500ML POUR BTL (IV SOLUTION) ×3 IMPLANT
PACK EXTREMITY ARMC (MISCELLANEOUS) ×3 IMPLANT
SPLINT WRIST M RT TX990303 (SOFTGOODS) ×2 IMPLANT
STOCKINETTE IMPERVIOUS 9X36 MD (GAUZE/BANDAGES/DRESSINGS) ×3 IMPLANT
STRAP BODY AND KNEE 60X3 (MISCELLANEOUS) ×3 IMPLANT
SUT PROLENE 4 0 PS 2 18 (SUTURE) ×3 IMPLANT

## 2018-06-30 NOTE — Anesthesia Procedure Notes (Signed)
Procedure Name: MAC Date/Time: 06/30/2018 11:22 AM Performed by: Cameron Ali, CRNA Pre-anesthesia Checklist: Patient identified, Emergency Drugs available, Suction available, Patient being monitored and Timeout performed Patient Re-evaluated:Patient Re-evaluated prior to induction Oxygen Delivery Method: Simple face mask Placement Confirmation: positive ETCO2 and breath sounds checked- equal and bilateral

## 2018-06-30 NOTE — Anesthesia Postprocedure Evaluation (Signed)
Anesthesia Post Note  Patient: Theresa Davidson  Procedure(s) Performed: CARPAL TUNNEL RELEASE ENDOSCOPIC (Right Wrist)  Patient location during evaluation: PACU Anesthesia Type: Bier Block Level of consciousness: awake and alert, oriented and patient cooperative Pain management: pain level controlled Vital Signs Assessment: post-procedure vital signs reviewed and stable Respiratory status: spontaneous breathing, nonlabored ventilation and respiratory function stable Cardiovascular status: blood pressure returned to baseline and stable Postop Assessment: adequate PO intake Anesthetic complications: no    Reed Breech

## 2018-06-30 NOTE — H&P (Signed)
Paper H&P to be scanned into permanent record. H&P reviewed and patient re-examined. No changes. 

## 2018-06-30 NOTE — Transfer of Care (Signed)
Immediate Anesthesia Transfer of Care Note  Patient: Theresa Davidson  Procedure(s) Performed: CARPAL TUNNEL RELEASE ENDOSCOPIC (Right Wrist)  Patient Location: PACU  Anesthesia Type: General, Bier Block  Level of Consciousness: awake, alert  and patient cooperative  Airway and Oxygen Therapy: Patient Spontanous Breathing and Patient connected to supplemental oxygen  Post-op Assessment: Post-op Vital signs reviewed, Patient's Cardiovascular Status Stable, Respiratory Function Stable, Patent Airway and No signs of Nausea or vomiting  Post-op Vital Signs: Reviewed and stable  Complications: No apparent anesthesia complications

## 2018-06-30 NOTE — Anesthesia Preprocedure Evaluation (Signed)
Anesthesia Evaluation  Patient identified by MRN, date of birth, ID band Patient awake    Reviewed: Allergy & Precautions, NPO status , Patient's Chart, lab work & pertinent test results  History of Anesthesia Complications Negative for: history of anesthetic complications  Airway Mallampati: I   Neck ROM: Full    Dental  (+)    Pulmonary neg pulmonary ROS,    Pulmonary exam normal breath sounds clear to auscultation       Cardiovascular Exercise Tolerance: Good hypertension, Normal cardiovascular exam Rhythm:Regular Rate:Normal     Neuro/Psych  Headaches,    GI/Hepatic negative GI ROS,   Endo/Other  negative endocrine ROS  Renal/GU negative Renal ROS     Musculoskeletal   Abdominal   Peds  Hematology negative hematology ROS (+)   Anesthesia Other Findings   Reproductive/Obstetrics                             Anesthesia Physical Anesthesia Plan  ASA: II  Anesthesia Plan: General and Bier Block and Bier Block-LIDOCAINE ONLY   Post-op Pain Management:  Regional for Post-op pain and GA combined w/ Regional for post-op pain   Induction: Intravenous  PONV Risk Score and Plan: 3 and Propofol infusion, TIVA and Ondansetron  Airway Management Planned: Natural Airway  Additional Equipment:   Intra-op Plan:   Post-operative Plan:   Informed Consent: I have reviewed the patients History and Physical, chart, labs and discussed the procedure including the risks, benefits and alternatives for the proposed anesthesia with the patient or authorized representative who has indicated his/her understanding and acceptance.     Plan Discussed with: CRNA  Anesthesia Plan Comments:         Anesthesia Quick Evaluation

## 2018-06-30 NOTE — Discharge Instructions (Signed)
General Anesthesia, Adult, Care After °This sheet gives you information about how to care for yourself after your procedure. Your health care provider may also give you more specific instructions. If you have problems or questions, contact your health care provider. °What can I expect after the procedure? °After the procedure, the following side effects are common: °· Pain or discomfort at the IV site. °· Nausea. °· Vomiting. °· Sore throat. °· Trouble concentrating. °· Feeling cold or chills. °· Weak or tired. °· Sleepiness and fatigue. °· Soreness and body aches. These side effects can affect parts of the body that were not involved in surgery. °Follow these instructions at home: ° °For at least 24 hours after the procedure: °· Have a responsible adult stay with you. It is important to have someone help care for you until you are awake and alert. °· Rest as needed. °· Do not: °? Participate in activities in which you could fall or become injured. °? Drive. °? Use heavy machinery. °? Drink alcohol. °? Take sleeping pills or medicines that cause drowsiness. °? Make important decisions or sign legal documents. °? Take care of children on your own. °Eating and drinking °· Follow any instructions from your health care provider about eating or drinking restrictions. °· When you feel hungry, start by eating small amounts of foods that are soft and easy to digest (bland), such as toast. Gradually return to your regular diet. °· Drink enough fluid to keep your urine pale yellow. °· If you vomit, rehydrate by drinking water, juice, or clear broth. °General instructions °· If you have sleep apnea, surgery and certain medicines can increase your risk for breathing problems. Follow instructions from your health care provider about wearing your sleep device: °? Anytime you are sleeping, including during daytime naps. °? While taking prescription pain medicines, sleeping medicines, or medicines that make you drowsy. °· Return to  your normal activities as told by your health care provider. Ask your health care provider what activities are safe for you. °· Take over-the-counter and prescription medicines only as told by your health care provider. °· If you smoke, do not smoke without supervision. °· Keep all follow-up visits as told by your health care provider. This is important. °Contact a health care provider if: °· You have nausea or vomiting that does not get better with medicine. °· You cannot eat or drink without vomiting. °· You have pain that does not get better with medicine. °· You are unable to pass urine. °· You develop a skin rash. °· You have a fever. °· You have redness around your IV site that gets worse. °Get help right away if: °· You have difficulty breathing. °· You have chest pain. °· You have blood in your urine or stool, or you vomit blood. °Summary °· After the procedure, it is common to have a sore throat or nausea. It is also common to feel tired. °· Have a responsible adult stay with you for the first 24 hours after general anesthesia. It is important to have someone help care for you until you are awake and alert. °· When you feel hungry, start by eating small amounts of foods that are soft and easy to digest (bland), such as toast. Gradually return to your regular diet. °· Drink enough fluid to keep your urine pale yellow. °· Return to your normal activities as told by your health care provider. Ask your health care provider what activities are safe for you. °This information is not   intended to replace advice given to you by your health care provider. Make sure you discuss any questions you have with your health care provider. °Document Released: 09/15/2000 Document Revised: 01/23/2017 Document Reviewed: 01/23/2017 °Elsevier Interactive Patient Education © 2019 [employer] ° ° °Orthopedic discharge instructions: °Keep dressing dry and intact. °Keep hand elevated above heart level. °May shower after dressing  removed on postop day 4 (Sunday). °Cover sutures with Band-Aids after drying off. °Apply ice to affected area frequently. °Take ibuprofen 600-800 mg TID with meals for 7-10 days, then as necessary. °Take ES Tylenol or pain medication as prescribed when needed.  °Return for follow-up in 10-14 days or as scheduled. °

## 2018-06-30 NOTE — Op Note (Signed)
06/30/2018  11:55 AM  Patient:   Theresa Davidson  Pre-Op Diagnosis:   Right carpal tunnel syndrome.  Post-Op Diagnosis:   Same.  Procedure:   Endoscopic right carpal tunnel release.  Surgeon:   Maryagnes Amos, MD  Anesthesia:   Bier block  Findings:   As above.  Complications:   None  EBL:   0 cc  Fluids:   350 cc crystalloid  TT:   24 minutes at 250 mmHg  Drains:   None  Closure:   4-0 Prolene interrupted sutures  Brief Clinical Note:   The patient is a 38 year old female with a history of progressively worsening pain and paresthesias to her right hand. Her symptoms have persisted despite medications, activity modification, splinting, and even a cortisone injection. Her history and examination are consistent with carpal tunnel syndrome. The patient presents at this time for an endoscopic right carpal tunnel release.   Procedure:   The patient was brought into the operating room and lain in the supine position. After adequate IV sedation was achieved, a timeout was performed to verify the appropriate surgical site before a Bier block was placed by the anesthesiologist and the tourniquet inflated to 250 mmHg. The right hand and upper extremity were prepped with ChloraPrep solution before being draped sterilely. Preoperative antibiotics were administered. An approximately 1.5-2 cm incision was made over the volar wrist flexion crease, centered over the palmaris longus tendon. The incision was carried down through the subcutaneous tissues with care taken to identify and protect any neurovascular structures. The distal forearm fascia was penetrated just proximal to the transverse carpal ligament. The soft tissues were released off the superficial and deep surfaces of the distal forearm fascia and this was released proximally for 3-4 cm under direct visualization.  Attention was directed distally. The Therapist, nutritional was passed beneath the transverse carpal ligament along the ulnar aspect of  the carpal tunnel and used to release any adhesions as well as to remove any adherent synovial tissue before first the smaller then the larger of the two dilators were passed beneath the transverse carpal ligament along the ulnar margin of the carpal tunnel. The slotted cannula was introduced and the endoscope was placed into the slotted cannula and the undersurface of the transverse carpal ligament visualized. The distal margin of the transverse carpal ligament was marked by placing a 25-gauge needle percutaneously at Kaplan's cardinal point so that it entered the distal portion of the slotted cannula. Under endoscopic visualization, the transverse carpal ligament was released from proximal to distal using the end-cutting blade. A second pass was performed to ensure complete release of the ligament. The adequacy of release was verified both endoscopically and by palpation using the freer elevator.  The wound was irrigated thoroughly with sterile saline solution before being closed using 4-0 Prolene interrupted sutures. A total of 10 cc of 0.5% plain Sensorcaine was injected in and around the incision before a sterile bulky dressing was applied to the wound. The patient was placed into a volar wrist splint before being awakened and returned to the recovery room in satisfactory condition after tolerating the procedure well.

## 2018-06-30 NOTE — Anesthesia Procedure Notes (Signed)
Anesthesia Regional Block: Bier block (IV Regional)   Pre-Anesthetic Checklist: ,, timeout performed, Correct Patient, Correct Site, Correct Laterality, Correct Procedure, Correct Position, site marked, Risks and benefits discussed,  Surgical consent,  Pre-op evaluation,  At surgeon's request and post-op pain management  Laterality: Right  Prep: alcohol swabs       Needles:  Injection technique: Single-shot      Needle Gauge: 22     Additional Needles:   Procedures:,,,,, intact distal pulses, Esmarch exsanguination,, #20gu IV placed and double tourniquet utilized  Narrative:  Start time: 06/30/2018 11:28 AM End time: 06/30/2018 11:30 AM  Performed by: Personally  Anesthesiologist: Reed Breech, MD

## 2018-07-01 ENCOUNTER — Encounter: Payer: Self-pay | Admitting: Surgery

## 2019-02-14 IMAGING — US US MFM OB DETAIL+14 WK
1 series · 13 of 28 positions shown · non-contrast
Comparison: none

PATIENT INFO:

PERFORMED BY:
SERVICE(S) PROVIDED:
INDICATIONS:
21 weeks gestation of pregnancy
Abnormal AFP
FETAL EVALUATION:
Num Of Fetuses:     1
Fetal Heart         160
Rate(bpm):
Cardiac Activity:   Present
Presentation:       Cephalic
Placenta:           Posterior, No previa
P. Cord Insertion:  Normal
BIOMETRY:
BPD:      50.8  mm     G. Age:  21w 3d         47  %    CI:         75.5   %    70 - 86
FL/HC:      18.5   %    15.9 -
HC:      185.4  mm     G. Age:  20w 6d         19  %
FL/BPD:     67.5   %
FL:       34.3  mm     G. Age:  20w 6d         21  %
HUM:        33  mm     G. Age:  21w 1d         40  %
GESTATIONAL AGE:
LMP:           17w 5d        Date:  06/06/17                 EDD:   03/13/18
U/S Today:     21w 0d                                        EDD:   02/18/18
Best:          21w 3d     Det. By:  Early Ultrasound         EDD:   02/15/18
(09/16/17)
ANATOMY:
Cavum:                 CSP visualized         Aortic Arch:            Normal appearance
Ventricles:            Normal appearance      Ductal Arch:            Normal appearance
Choroid Plexus:        Within Normal Limits   Diaphragm:              Within Normal Limits
Cerebellum:            Within Normal Limits   Stomach:                Seen
Posterior Fossa:       Within Normal Limits   Abdomen:                Within Normal
Limits
Nuchal Fold:           Within Normal Limits   Abdominal Wall:         Normal appearance
Face:                  Orbits visualized      Cord Vessels:           3 vessels
Lips:                  Normal appearance      Kidneys:                Normal appearance
Thoracic:              Within Normal Limits   Bladder:                Seen
Heart:                 4-Chamber view         Spine:                  Normal appearance
appears normal
RVOT:                  Normal appearance      Upper Extremities:      Visualized
LVOT:                  Normal appearance      Lower Extremities:      Visualized
CERVIX UTERUS ADNEXA:
Cervix
Length:            4.3  cm.

[Series 1: us mfm ob detail+14 wk · 0.22mm/px · 63 acquisitions, 13 frames shown]
[im 3/63]
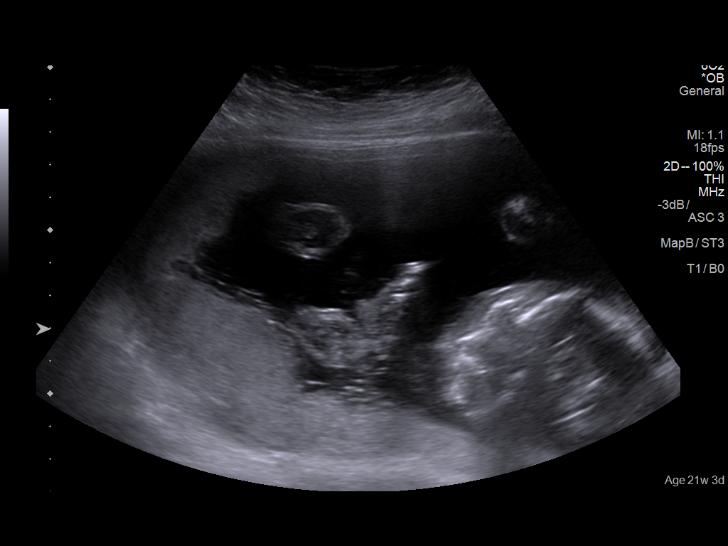
[im 7/63]
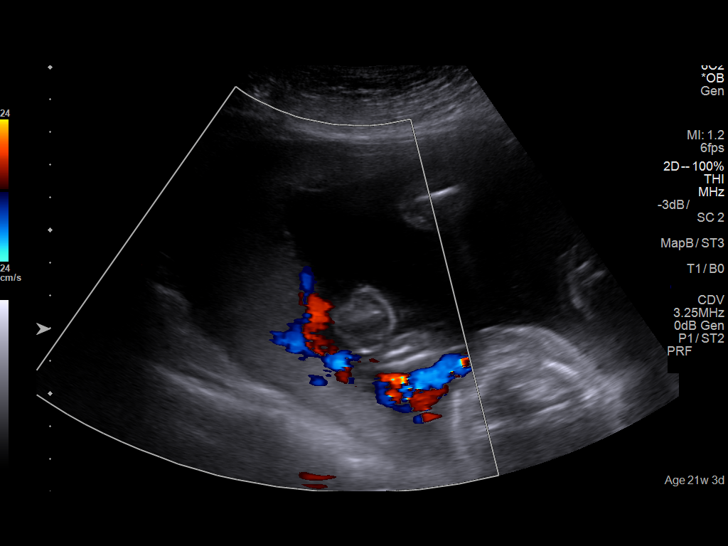
[im 12/63]
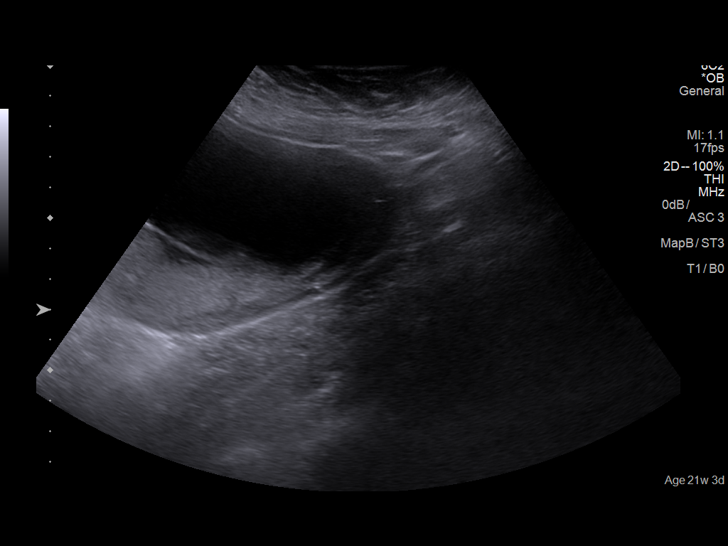
[im 17/63]
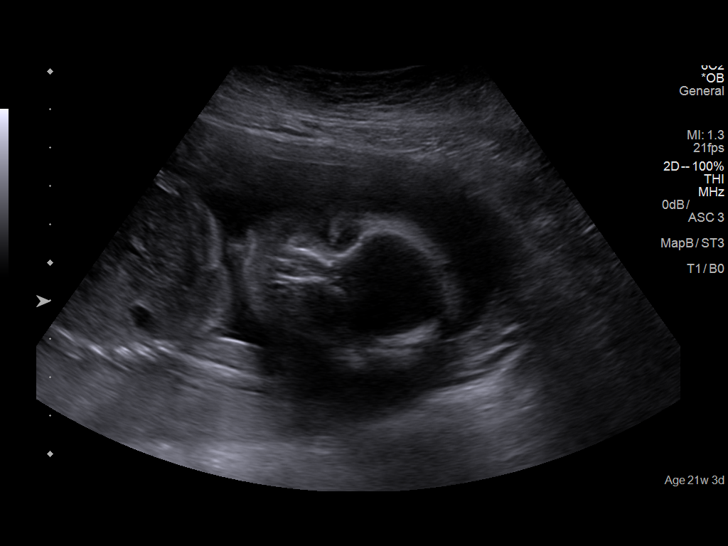
[im 21/63]
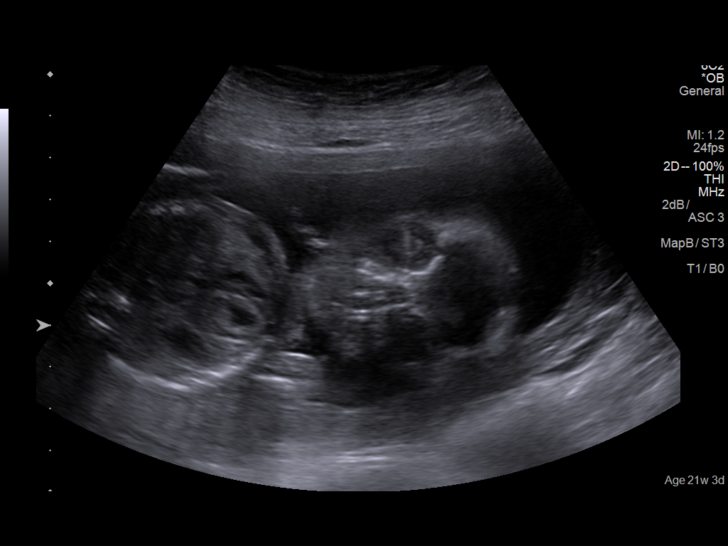
[im 26/63]
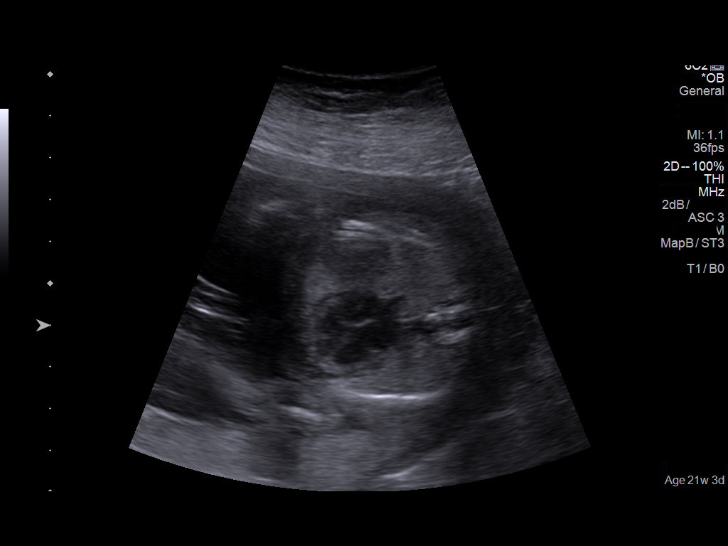
[im 33/63]
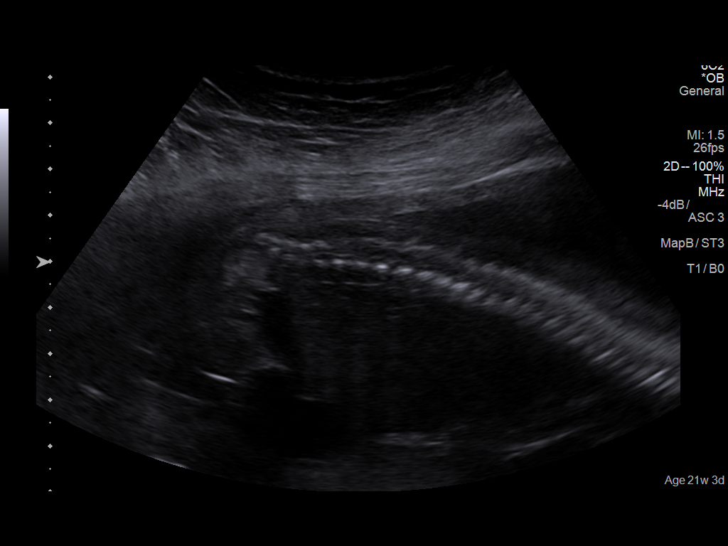
[im 37/63]
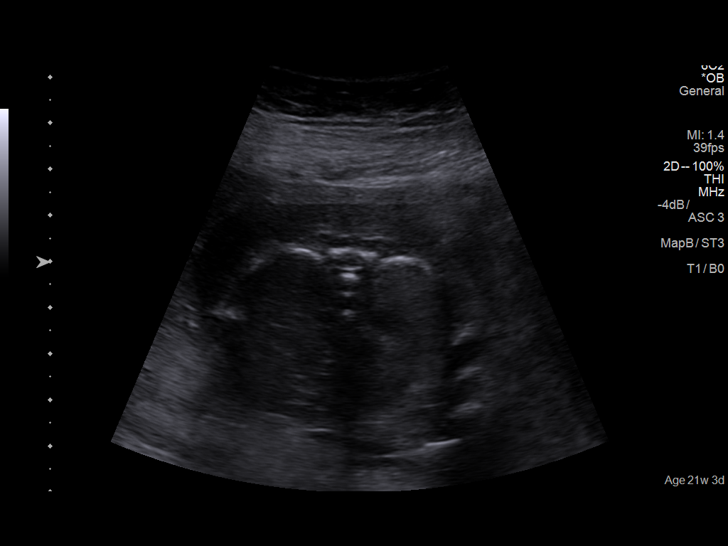
[im 42/63]
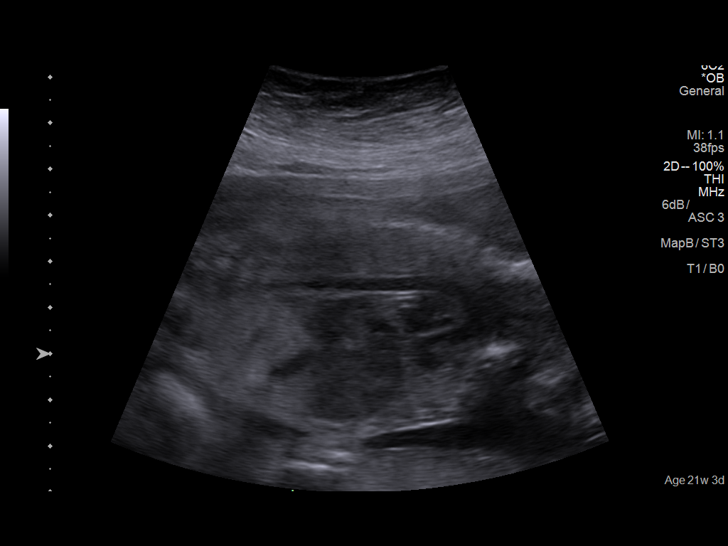
[im 46/63]
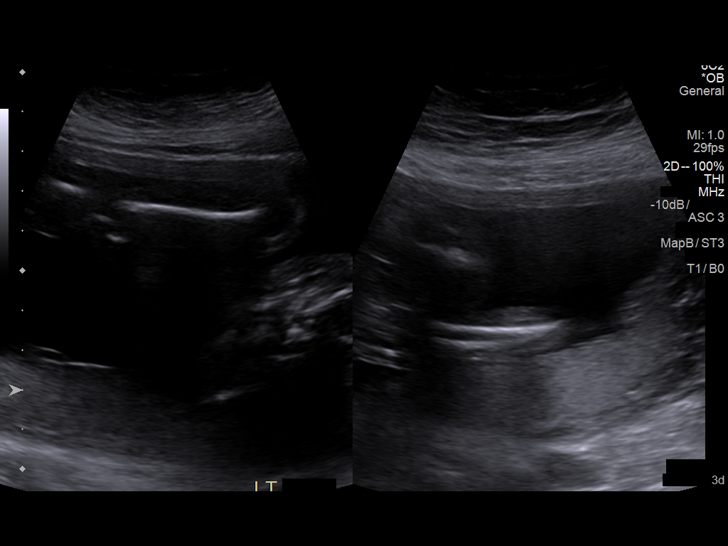
[im 51/63]
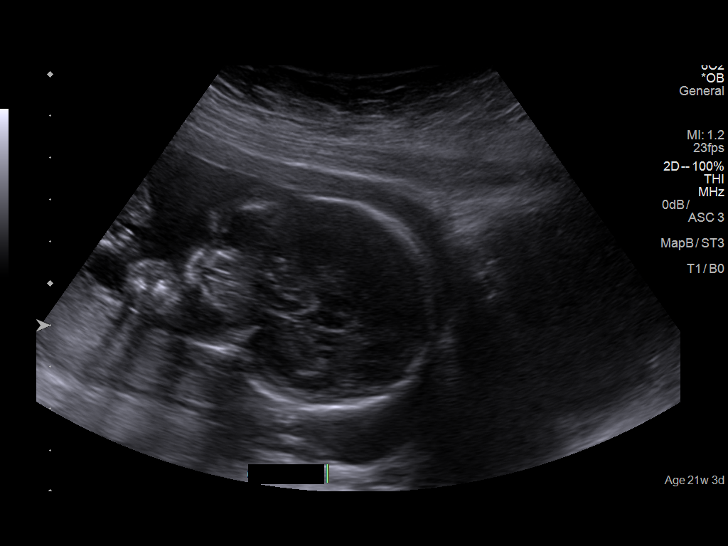
[im 56/63]
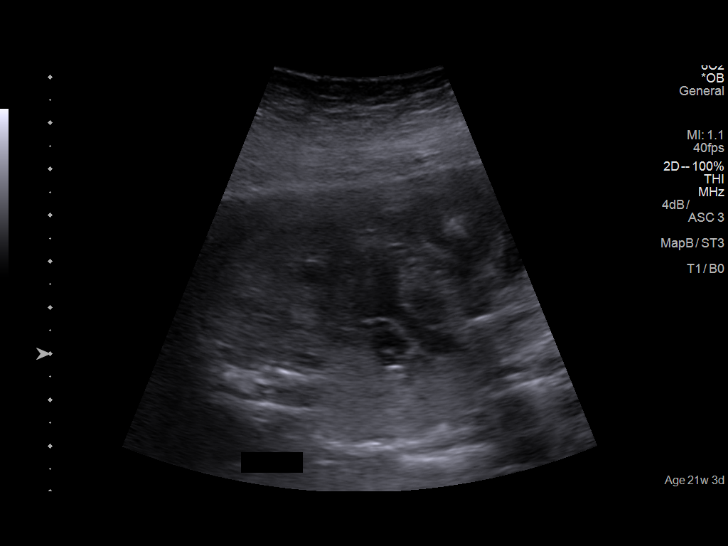
[im 60/63]
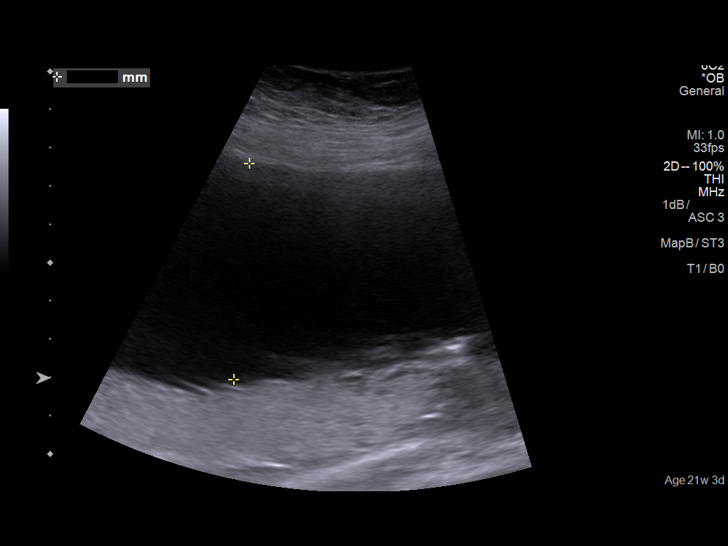

[13 of 28 positions shown; findings below may reference images not displayed]

IMPRESSION: Dear Ms. Jaup,

Thank you for referring your patient to [facility] for a
fetal anatomical survey due to AMA and abnormal quad
screen .

There is a singleton gestation at 28w9d gestation by 14w8d
ultrasound performed on 09/16/2017 at [facility].

The fetal biometry correlates with established dating.

Detailed evaluation of the fetal anatomy was performed.  The
fetal anatomical survey appears within normal limits.
Furthermore, no markers of aneuploidy were noted.

The patient met with our genetic counselor and decided
against any additional genetic testing.

RECOMMENDATIONS:

Thank you for allowing us to participate in your patient's care.

assistance.

## 2019-03-14 DIAGNOSIS — I8392 Asymptomatic varicose veins of left lower extremity: Secondary | ICD-10-CM | POA: Insufficient documentation

## 2021-06-04 ENCOUNTER — Other Ambulatory Visit: Payer: Self-pay | Admitting: Surgery

## 2021-06-10 ENCOUNTER — Other Ambulatory Visit: Payer: Self-pay

## 2021-06-10 ENCOUNTER — Other Ambulatory Visit
Admission: RE | Admit: 2021-06-10 | Discharge: 2021-06-10 | Disposition: A | Discharge status: Home / Self Care | Payer: Commercial Managed Care - PPO | Source: Ambulatory Visit | Attending: Surgery | Admitting: Surgery

## 2021-06-10 HISTORY — DX: Carpal tunnel syndrome, bilateral upper limbs: G56.03

## 2021-06-10 HISTORY — DX: Asymptomatic varicose veins of left lower extremity: I83.92

## 2021-06-10 HISTORY — DX: Hidradenitis suppurativa: L73.2

## 2021-06-10 HISTORY — DX: Raynaud's syndrome without gangrene: I73.00

## 2021-06-10 NOTE — Patient Instructions (Signed)
Your procedure is scheduled on: Tuesday June 18, 2021. Report to Day Surgery inside [facility] 2nd floor, stop by admissions desk before getting on elevator. To find out your arrival time please call [phone] between 1PM - 3PM on Friday June 14, 2021.  Remember: Instructions that are not followed completely may result in serious medical risk,  up to and including death, or upon the discretion of your surgeon and anesthesiologist your  surgery may need to be rescheduled.     _X__ 1. Do not eat food after midnight the night before your procedure.                 No chewing gum or hard candies. You may drink clear liquids up to 2 hours                 before you are scheduled to arrive for your surgery- DO not drink clear                 liquids within 2 hours of the start of your surgery.                 Clear Liquids include:  water, apple juice without pulp, clear Gatorade, G2 or                  Gatorade Zero (avoid Red/Purple/Blue), Black Coffee or Tea (Do not add                 anything to coffee or tea).  __X__2.   Complete the "Ensure Clear Pre-surgery Clear Carbohydrate Drink" provided to you, 2 hours before arrival. **If yo are diabetic you will be provided with an alternative drink, Gatorade Zero or G2.  __X__3.  On the morning of surgery brush your teeth with toothpaste and water, you                may rinse your mouth with mouthwash if you wish.  Do not swallow any toothpaste of mouthwash.     _X__ 4.  No Alcohol for 24 hours before or after surgery.   _X__ 5.  Do Not Smoke or use e-cigarettes For 24 Hours Prior to Your Surgery.                 Do not use any chewable tobacco products for at least 6 hours prior to                 Surgery.  _X__  6.  Do not use any recreational drugs (marijuana, cocaine, heroin, ecstasy, MDMA or other)                For at least one week prior to your surgery.  Combination of these drugs with anesthesia                 May have life threatening results.   __X__7.Marland Kitchen  Notify your doctor if there is any change in your medical condition      (cold, fever, infections).     Do not wear jewelry, make-up, hairpins, clips or nail polish. Do not wear lotions, powders, perfumes or deodorant. Do not shave 48 hours prior to surgery. Men may shave face and neck. Do not bring valuables to the hospital.    [facility] is not responsible for any belongings or valuables.  Contacts, dentures or bridgework may not be worn into surgery. Leave your suitcase in the car. After surgery it may be brought  to your room. For patients admitted to the hospital, discharge time is determined by your treatment team.   Patients discharged the day of surgery will not be allowed to drive home.   Make arrangements for someone to be with you for the first 24 hours of your Same Day Discharge.   __X__ Take these medicines the morning of surgery with A SIP OF WATER:    1. TROKENDI XR 200 MG  2.   3.   4.  5.  6.  ____ Fleet Enema (as directed)   __X__ Use CHG Soap (or wipes) as directed  ____ Use Benzoyl Peroxide Gel as instructed  ____ Use inhalers on the day of surgery  ____ Stop metformin 2 days prior to surgery    ____ Take 1/2 of usual insulin dose the night before surgery. No insulin the morning          of surgery.   ____ Call your PCP, cardiologist, or Pulmonologist if taking Coumadin/Plavix/aspirin and ask when to stop before your surgery.   __X__ One Week prior to surgery- Stop Anti-inflammatories such as Ibuprofen, Aleve, Advil, Motrin, meloxicam (MOBIC), diclofenac, etodolac, ketorolac, Toradol, Daypro, piroxicam, Goody's or BC powders. OK TO USE TYLENOL IF NEEDED   __X__ Do not start any new vitamins and or supplements until after surgery.    ____ Bring C-Pap to the hospital.    If you have any questions regarding your pre-procedure instructions,  Please call Pre-admit Testing at  [phone]

## 2021-06-10 NOTE — Progress Notes (Signed)
°  Perioperative Services Pre-Admission/Anesthesia Testing   Date: 06/10/21 Name: Theresa Davidson MRN:   [id number]  Re: Consideration of preoperative prophylactic antibiotic change   Request sent to: Poggi, Excell Seltzer, MD (routed and/or faxed via [facility])  Planned Surgical Procedure(s):    Case: [id number] Date/Time: 06/18/21 1410   Procedure: CARPAL TUNNEL RELEASE ENDOSCOPIC (Left: Shoulder)   Anesthesia type: Choice   Pre-op diagnosis: CARPAL TUNNEL SYNDROME, LEFT   Location: [facility] OR ROOM 03 / [facility] ORS FOR ANESTHESIA GROUP   Surgeons: Christena Flake, MD   Clinical Notes:  Patient has a documented allergy to PCN  The actual reaction to PCN is unknown. Occurred as a child.   Screened as appropriate for cephalosporin use during medication reconciliation No immediate angioedema, dysphagia, SOB, anaphylaxis symptoms. No severe rash involving mucous membranes or skin necrosis. No hospital admissions related to side effects of PCN/cephalosporin use.  No documented reaction to PCN or cephalosporin in the last 10 years.  Request:  As an evidence based approach to reducing the rate of incidence for post-operative SSI and the development of MDROs, could an agent with narrower coverage for preoperative prophylaxis in this patient's upcoming surgical course be considered?   Currently ordered preoperative prophylactic ABX: clindamycin.   Specifically requesting change to cephalosporin (CEFAZOLIN).   Please communicate decision with me and I will change the orders in Epic as per your direction.   Things to consider: Many patients report that they were "allergic" to PCN earlier in life, however this does not translate into a true lifelong allergy. Patients can lose sensitivity to specific IgE antibodies over time if PCN is avoided (Kleris & Lugar, 2019).  Up to 10% of the adult population and 15% of hospitalized patients report an allergy to PCN, however clinical studies suggest that 90% of those reporting  an allergy can tolerate PCN antibiotics (Kleris & Lugar, 2019).  Cross-sensitivity between PCN and cephalosporins has been documented as being as high as 10%, however this estimation included data believed to have been collected in a setting where there was contamination. Newer data suggests that the prevalence of cross-sensitivity between PCN and cephalosporins is actually estimated to be closer to 1% (Hermanides et al., 2018).   Patients labeled as PCN allergic, whether they are truly allergic or not, have been found to have inferior outcomes in terms of rates of serious infection, and these patients tend to have longer hospital stays [facility] & Lugar, 2019).  Treatment related secondary infections, such as Clostridioides difficile, have been linked to the improper use of broad spectrum antibiotics in patients improperly labeled as PCN allergic (Kleris & Lugar, 2019).  Anaphylaxis from cephalosporins is rare and the evidence suggests that there is no increased risk of an anaphylactic type reaction when cephalosporins are used in a PCN allergic patient (Pichichero, 2006).  Citations: Hermanides J, Lemkes BA, Prins Gwenyth Bender MW, Terreehorst I. Presumed ?-Lactam Allergy and Cross-reactivity in the Operating Theater: A Practical Approach. Anesthesiology. 2018 Aug;129(2):335-342. doi: 10.1097/ALN.0000000000002252. PMID: 74944967.  Kleris, R. S., & Lugar, P. L. (2019). Things We Do For No Reason: Failing to Question a Penicillin Allergy History. Journal of hospital medicine, 14(10), [phone]. Advance online publication. airportbarriers.com  Pichichero, M. E. (2006). Cephalosporins can be prescribed safely for penicillin-allergic patients. Journal of family medicine, 55(2), 106-112. Accessed: https://cdn.mdedge.com/files/s57fs-public/Document/September-2017/5502JFP_AppliedEvidence1.pdf   Quentin Mulling, MSN, APRN, FNP-C, CEN [facility]  Peri-operative Services Nurse  Practitioner FAX: [phone] 06/10/21 3:35 PM

## 2021-06-13 NOTE — Progress Notes (Signed)
°  Perioperative Services Pre-Admission/Anesthesia Testing     Date: 06/13/21  Name: Theresa Davidson MRN:   [id number]  Re: Change in ABX for upcoming surgery   Case: [id number] Date/Time: 06/18/21 1410   Procedure: CARPAL TUNNEL RELEASE ENDOSCOPIC (Left: Shoulder)   Anesthesia type: Choice   Pre-op diagnosis: CARPAL TUNNEL SYNDROME, LEFT   Location: [facility] OR ROOM 03 / [facility] ORS FOR ANESTHESIA GROUP   Surgeons: Christena Flake, MD   Primary attending surgeon was consulted regarding consideration of therapeutic change in antimicrobial agent being used for preoperative prophylaxis in this patient's upcoming surgical case. Following analysis of the risk versus benefits, the patient's primary attending surgeon advised that it would be acceptable to discontinue the ordered clindamycin and place an order for cefazolin 2 gm IV on call to the OR. Orders for this patient were amended by me following collaborative conversation with attending surgeon taking into consideration of risk versus benefits associated with the change in therapy.  Quentin Mulling, MSN, APRN, FNP-C, CEN [facility]  Peri-operative Services Nurse Practitioner Phone: [phone] 06/13/21 7:58 AM

## 2021-06-18 ENCOUNTER — Encounter: Payer: Self-pay | Admitting: Surgery

## 2021-06-18 ENCOUNTER — Ambulatory Visit
Admission: RE | Admit: 2021-06-18 | Discharge: 2021-06-18 | Disposition: A | Discharge status: Home / Self Care | Payer: Commercial Managed Care - PPO | Attending: Surgery | Admitting: Surgery

## 2021-06-18 ENCOUNTER — Ambulatory Visit: Payer: Commercial Managed Care - PPO | Admitting: Urgent Care

## 2021-06-18 ENCOUNTER — Encounter
Admission: RE | Disposition: A | Discharge status: Home / Self Care | Payer: Self-pay | Source: Home / Self Care | Attending: Surgery

## 2021-06-18 ENCOUNTER — Other Ambulatory Visit: Payer: Self-pay

## 2021-06-18 DIAGNOSIS — G5602 Carpal tunnel syndrome, left upper limb: Secondary | ICD-10-CM | POA: Insufficient documentation

## 2021-06-18 DIAGNOSIS — Z9889 Other specified postprocedural states: Secondary | ICD-10-CM | POA: Diagnosis not present

## 2021-06-18 DIAGNOSIS — K219 Gastro-esophageal reflux disease without esophagitis: Secondary | ICD-10-CM | POA: Diagnosis not present

## 2021-06-18 HISTORY — PX: CARPAL TUNNEL RELEASE: SHX101

## 2021-06-18 LAB — POCT PREGNANCY, URINE: Preg Test, Ur: NEGATIVE

## 2021-06-18 SURGERY — RELEASE, CARPAL TUNNEL, ENDOSCOPIC
Anesthesia: General | Site: Shoulder | Laterality: Left

## 2021-06-18 MED ORDER — CEFAZOLIN SODIUM-DEXTROSE 2-4 GM/100ML-% IV SOLN
INTRAVENOUS | Status: AC
  Filled 2021-06-18: qty 100

## 2021-06-18 MED ORDER — FENTANYL CITRATE (PF) 100 MCG/2ML IJ SOLN
INTRAMUSCULAR | Status: DC | PRN
  Administered 2021-06-18: 50 ug via INTRAVENOUS

## 2021-06-18 MED ORDER — LIDOCAINE HCL (CARDIAC) PF 100 MG/5ML IV SOSY
PREFILLED_SYRINGE | INTRAVENOUS | Status: DC | PRN
  Administered 2021-06-18: 50 mg via INTRAVENOUS

## 2021-06-18 MED ORDER — FAMOTIDINE 20 MG PO TABS: 20.0000 mg | ORAL_TABLET | Freq: Once | ORAL | Status: AC

## 2021-06-18 MED ORDER — DEXAMETHASONE SODIUM PHOSPHATE 10 MG/ML IJ SOLN
INTRAMUSCULAR | Status: DC | PRN
  Administered 2021-06-18: 10 mg via INTRAVENOUS

## 2021-06-18 MED ORDER — FAMOTIDINE 20 MG PO TABS
ORAL_TABLET | ORAL | Status: AC
  Administered 2021-06-18: 12:00:00 20 mg via ORAL
  Filled 2021-06-18: qty 1

## 2021-06-18 MED ORDER — CEFAZOLIN SODIUM-DEXTROSE 2-4 GM/100ML-% IV SOLN
2.0000 g | Freq: Once | INTRAVENOUS | Status: AC
  Administered 2021-06-18: 14:00:00 2 g via INTRAVENOUS

## 2021-06-18 MED ORDER — BUPIVACAINE HCL (PF) 0.5 % IJ SOLN
INTRAMUSCULAR | Status: DC | PRN
  Administered 2021-06-18: 10 mL

## 2021-06-18 MED ORDER — MIDAZOLAM HCL 2 MG/2ML IJ SOLN
INTRAMUSCULAR | Status: AC
  Filled 2021-06-18: qty 2

## 2021-06-18 MED ORDER — LIDOCAINE HCL (PF) 2 % IJ SOLN
INTRAMUSCULAR | Status: AC
  Filled 2021-06-18: qty 5

## 2021-06-18 MED ORDER — MIDAZOLAM HCL 2 MG/2ML IJ SOLN
INTRAMUSCULAR | Status: DC | PRN
  Administered 2021-06-18: 2 mg via INTRAVENOUS

## 2021-06-18 MED ORDER — ACETAMINOPHEN 10 MG/ML IV SOLN
INTRAVENOUS | Status: AC
  Filled 2021-06-18: qty 100

## 2021-06-18 MED ORDER — DEXMEDETOMIDINE HCL IN NACL 200 MCG/50ML IV SOLN
INTRAVENOUS | Status: DC | PRN
  Administered 2021-06-18: 10 ug via INTRAVENOUS

## 2021-06-18 MED ORDER — 0.9 % SODIUM CHLORIDE (POUR BTL) OPTIME
TOPICAL | Status: DC | PRN
  Administered 2021-06-18: 14:00:00 250 mL

## 2021-06-18 MED ORDER — FENTANYL CITRATE (PF) 100 MCG/2ML IJ SOLN
INTRAMUSCULAR | Status: AC
  Filled 2021-06-18: qty 2

## 2021-06-18 MED ORDER — PROPOFOL 10 MG/ML IV BOLUS
INTRAVENOUS | Status: DC | PRN
  Administered 2021-06-18: 160 mg via INTRAVENOUS

## 2021-06-18 MED ORDER — CHLORHEXIDINE GLUCONATE 0.12 % MT SOLN: 15.0000 mL | Freq: Once | OROMUCOSAL | Status: AC

## 2021-06-18 MED ORDER — ONDANSETRON HCL 4 MG/2ML IJ SOLN
INTRAMUSCULAR | Status: AC
  Filled 2021-06-18: qty 2

## 2021-06-18 MED ORDER — LACTATED RINGERS IV SOLN: INTRAVENOUS | Status: DC

## 2021-06-18 MED ORDER — ORAL CARE MOUTH RINSE: 15.0000 mL | Freq: Once | OROMUCOSAL | Status: AC

## 2021-06-18 MED ORDER — PROPOFOL 10 MG/ML IV BOLUS
INTRAVENOUS | Status: AC
  Filled 2021-06-18: qty 20

## 2021-06-18 MED ORDER — DEXAMETHASONE SODIUM PHOSPHATE 10 MG/ML IJ SOLN
INTRAMUSCULAR | Status: AC
  Filled 2021-06-18: qty 1

## 2021-06-18 MED ORDER — ONDANSETRON HCL 4 MG/2ML IJ SOLN
INTRAMUSCULAR | Status: DC | PRN
  Administered 2021-06-18: 4 mg via INTRAVENOUS

## 2021-06-18 MED ORDER — CHLORHEXIDINE GLUCONATE 0.12 % MT SOLN
OROMUCOSAL | Status: AC
  Administered 2021-06-18: 12:00:00 15 mL via OROMUCOSAL
  Filled 2021-06-18: qty 15

## 2021-06-18 MED ORDER — BUPIVACAINE HCL (PF) 0.5 % IJ SOLN
INTRAMUSCULAR | Status: AC
  Filled 2021-06-18: qty 30

## 2021-06-18 SURGICAL SUPPLY — 34 items
APL PRP STRL LF DISP 70% ISPRP (MISCELLANEOUS) ×1
BNDG COHESIVE 4X5 TAN ST LF (GAUZE/BANDAGES/DRESSINGS) ×3 IMPLANT
BNDG ELASTIC 2X5.8 VLCR STR LF (GAUZE/BANDAGES/DRESSINGS) ×3 IMPLANT
BNDG ESMARK 4X12 TAN STRL LF (GAUZE/BANDAGES/DRESSINGS) ×3 IMPLANT
CHLORAPREP W/TINT 26 (MISCELLANEOUS) ×3 IMPLANT
CORD BIP STRL DISP 12FT (MISCELLANEOUS) ×3 IMPLANT
CUFF TOURN SGL QUICK 18X4 (TOURNIQUET CUFF) ×1 IMPLANT
DRAPE SURG 17X11 SM STRL (DRAPES) ×3 IMPLANT
FORCEPS JEWEL BIP 4-3/4 STR (INSTRUMENTS) ×3 IMPLANT
GAUZE 4X4 16PLY ~~LOC~~+RFID DBL (SPONGE) ×3 IMPLANT
GAUZE SPONGE 4X4 12PLY STRL (GAUZE/BANDAGES/DRESSINGS) ×3 IMPLANT
GAUZE XEROFORM 1X8 LF (GAUZE/BANDAGES/DRESSINGS) ×3 IMPLANT
GLOVE SURG ENC MOIS LTX SZ8 (GLOVE) ×3 IMPLANT
GLOVE SURG UNDER LTX SZ8 (GLOVE) ×3 IMPLANT
GOWN STRL REUS W/ TWL LRG LVL3 (GOWN DISPOSABLE) ×1 IMPLANT
GOWN STRL REUS W/ TWL XL LVL3 (GOWN DISPOSABLE) ×1 IMPLANT
GOWN STRL REUS W/TWL LRG LVL3 (GOWN DISPOSABLE) ×3
GOWN STRL REUS W/TWL XL LVL3 (GOWN DISPOSABLE) ×3
KIT CARPAL TUNNEL (MISCELLANEOUS) ×3
KIT ESCP INSRT D SLOT CANN KN (MISCELLANEOUS) ×1 IMPLANT
KIT TURNOVER KIT A (KITS) ×3 IMPLANT
MANIFOLD NEPTUNE II (INSTRUMENTS) ×3 IMPLANT
NS IRRIG 500ML POUR BTL (IV SOLUTION) ×3 IMPLANT
PACK EXTREMITY ARMC (MISCELLANEOUS) ×3 IMPLANT
SPLINT WRIST LG LT TX990309 (SOFTGOODS) ×2 IMPLANT
SPLINT WRIST LG RT TX900304 (SOFTGOODS) IMPLANT
SPLINT WRIST M LT TX990308 (SOFTGOODS) IMPLANT
SPLINT WRIST M RT TX990303 (SOFTGOODS) IMPLANT
SPLINT WRIST XL LT TX990310 (SOFTGOODS) IMPLANT
SPLINT WRIST XL RT TX990305 (SOFTGOODS) IMPLANT
STOCKINETTE IMPERVIOUS 9X36 MD (GAUZE/BANDAGES/DRESSINGS) ×3 IMPLANT
SUT PROLENE 4 0 PS 2 18 (SUTURE) ×3 IMPLANT
SYR BULB IRRIG 60ML STRL (SYRINGE) ×2 IMPLANT
WATER STERILE IRR 500ML POUR (IV SOLUTION) ×3 IMPLANT

## 2021-06-18 NOTE — Discharge Instructions (Addendum)
Orthopedic discharge instructions: Keep dressing dry and intact. Keep hand elevated above heart level. May shower after dressing removed on postop day 4 (Saturday). Cover sutures with Band-Aids after drying off. Apply ice to affected area frequently. Take ibuprofen 600-800 mg TID with meals for 7-10 days, then as necessary. Take ES Tylenol as prescribed when needed.  Return for follow-up in 10-14 days or as scheduled.  AMBULATORY SURGERY  DISCHARGE INSTRUCTIONS   The drugs that you were given will stay in your system until tomorrow so for the next 24 hours you should not:  Drive an automobile Make any legal decisions Drink any alcoholic beverage   You may resume regular meals tomorrow.  Today it is better to start with liquids and gradually work up to solid foods.  You may eat anything you prefer, but it is better to start with liquids, then soup and crackers, and gradually work up to solid foods.   Please notify your doctor immediately if you have any unusual bleeding, trouble breathing, redness and pain at the surgery site, drainage, fever, or pain not relieved by medication.    Additional Instructions:        Please contact your physician with any problems or Same Day Surgery at [phone], Monday through Friday 6 am to 4 pm, or [place] at [facility] number at [phone].

## 2021-06-18 NOTE — H&P (Signed)
History of Present Illness: Theresa Davidson is a 40 y.o. who presents today for a history and physical. She is to undergo a left endoscopic carpal tunnel release on 06/18/2021. Last seen in the clinic on 04/05/2021. No change in her condition since that time.  The patient notes that her symptoms are unchanged since her last visit 2 years ago. 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 The symptoms occasionally will awaken her from sleep as well. She has not been taking any medications and has stopped using the splints as she did not find them to be of any benefit. She is now 2 years status post an endoscopic right carpal tunnel release from which she has done quite well. However, she does note some soreness in the palmar aspect of her right wrist after working last night. There has been no recurrence of her numbness or paresthesias to the hand however. She is ready to consider more aggressive procedures for her left wrist at this time.  Past Medical History:  Allergic state   Migraine   Past Surgical History:  endoscopic right carpal tunnel release Right 06/30/2018 (Dr. Joice Lofts)   IUD insertion   Past Family History:  High blood pressure (Hypertension) Mother   Scleroderma Mother   High blood pressure (Hypertension) Father   Other Father (retinopathatosis)   Medications:  levonorgestrel (LILETTA IU) Insert into the uterus   rizatriptan (MAXALT) 10 MG tablet as directed   topiramate (TROKENDI XR) 200 mg XR capsule Take by mouth   QUDEXY XR 200 mg CSpX Take 1 capsule by mouth once daily for 90 days 90 capsule 3   topiramate (TROKENDI XR) 200 mg XR capsule Take 1 capsule (200 mg total) by mouth once daily for 30 days (Patient not taking: Reported on 04/05/2021) 30 capsule 11   Allergies:  Penicillins Unknown (As a child)   Review of Systems: A comprehensive 14 point ROS was  performed, reviewed, and the pertinent orthopaedic findings are documented in the HPI.  Physical Exam: BP 128/80 (BP Location: Left upper arm, Patient Position: Sitting, BP Cuff Size: Adult)   Ht 165.1 cm (5\' 5" )   Wt 93.8 kg (206 lb 12.8 oz)   BMI 34.41 kg/m   General: Well-developed well-nourished female seen in no acute distress.   HEENT: Atraumatic,normocephalic. Pupils are equal and reactive to light. Oropharynx is clear with moist mucosa  Lungs: Clear to auscultation bilaterally   Cardiovascular: Regular rate and rhythm. Normal S1, S2. No murmurs. No appreciable gallops or rubs. Peripheral pulses are palpable.  Abdomen: Soft, non-tender, nondistended. Bowel sounds present  Left wrist exam: Skin inspection of the left wrist is unremarkable. No swelling, erythema, ecchymosis, abrasions, or other skin abnormalities are identified. There is no tenderness to palpation over the dorsal or palmar aspects of the left wrist. She is able active flex and extend her wrist fully without any pain or catching. She is able active flex and extend all digits fully without any pain or triggering, and is able to oppose her thumb to the base of little finger. She is neurovascularly intact to all digits. She has an equivocally positive Phalen's test, but a negative Tinel's test over the carpal tunnel.  Vascular : Peripheral pulses felt to be palpable. Capillary refill appears to be intact and within normal limits  X-ray: X-rays taken in [place] clinic orthopedics demonstrated no evidence of any fractures, lytic  lesions or significant degenerative changes.  Impression: 1. Carpal tunnel syndrome left wrist 2. Status post endoscopic carpal tunnel release right wrist  Plan:  The treatment options, including both surgical and nonsurgical choices, have been discussed in detail with the patient. She would like to proceed with surgical intervention to include an endoscopic left carpal tunnel release. The  risks (including bleeding, infection, nerve and/or blood vessel injury, persistent or recurrent pain/paresthesias, weakness of grip, need for further surgery, blood clots, strokes, heart attacks or arrhythmias, pneumonia, etc.) and benefits of the surgical procedure were discussed. The patient states her understanding and agrees to proceed. A formal written consent will be obtained by the nursing staff.   H&P reviewed and patient re-examined. No changes.

## 2021-06-18 NOTE — Transfer of Care (Signed)
Immediate Anesthesia Transfer of Care Note  Patient: Theresa Davidson  Procedure(s) Performed: CARPAL TUNNEL RELEASE ENDOSCOPIC (Left: Shoulder)  Patient Location: PACU  Anesthesia Type:General  Level of Consciousness: awake and drowsy  Airway & Oxygen Therapy: Patient Spontanous Breathing  Post-op Assessment: Report given to RN and Post -op Vital signs reviewed and stable  Post vital signs: Reviewed and stable  Last Vitals:  Vitals Value Taken Time  BP 97/55 06/18/21 1408  Temp    Pulse 78 06/18/21 1408  Resp 18 06/18/21 1408  SpO2 94 % 06/18/21 1408  Vitals shown include unvalidated device data.  Last Pain:  Vitals:   06/18/21 1143  TempSrc: Temporal  PainSc: 0-No pain         Complications: No notable events documented.

## 2021-06-18 NOTE — Op Note (Signed)
06/18/2021  2:05 PM  Patient:   Theresa Davidson  Pre-Op Diagnosis:   Left carpal tunnel syndrome.  Post-Op Diagnosis:   Same.  Procedure:   Endoscopic left carpal tunnel release.  Surgeon:   Maryagnes Amos, MD  Anesthesia:   General LMA  Findings:   As above.  Complications:   None  EBL:   0 cc  Fluids:   700 cc crystalloid  TT:   11 minutes at 250 mmHg  Drains:   None  Closure:   4-0 Prolene interrupted sutures  Brief Clinical Note:   The patient is a 40 year old female with a history of progressive worsening pain and paresthesias to her left hand. Her symptoms have progressed despite medications, activity modification, etc. Her history and examination are consistent with carpal tunnel syndrome. The patient is now 3 years status post an endoscopic right carpal tunnel release from which she has done quite well. She presents at this time for an endoscopic left carpal tunnel release.   Procedure:   The patient was brought into the operating room and lain in the supine position. After adequate general laryngeal mask anesthesia was obtained, the left hand and upper extremity were prepped with ChloraPrep solution before being draped sterilely. Preoperative antibiotics were administered. A timeout was performed to verify the appropriate surgical site before the limb was exsanguinated with an Esmarch and the tourniquet inflated to 250 mmHg. An approximately 1.5-2 cm incision was made over the volar wrist flexion crease, centered over the palmaris longus tendon. The incision was carried down through the subcutaneous tissues with care taken to identify and protect any neurovascular structures. The distal forearm fascia was penetrated just proximal to the transverse carpal ligament. The soft tissues were released off the superficial and deep surfaces of the distal forearm fascia and this was released proximally for 3-4 cm under direct visualization.  Attention was directed distally. The Market researcher was passed beneath the transverse carpal ligament along the ulnar aspect of the carpal tunnel and used to release any adhesions as well as to remove any adherent synovial tissue before first the smaller then the larger of the two dilators were passed beneath the transverse carpal ligament along the ulnar margin of the carpal tunnel. The slotted cannula was introduced and the endoscope was placed into the slotted cannula and the undersurface of the transverse carpal ligament visualized. The distal margin of the transverse carpal ligament was marked by placing a 25-gauge needle percutaneously at Kaplan's cardinal point so that it entered the distal portion of the slotted cannula. Under endoscopic visualization, the transverse carpal ligament was released from proximal to distal using the end-cutting blade. A second pass was performed to ensure complete release of the ligament. The adequacy of release was verified both endoscopically and by palpation using the freer elevator.  The wound was irrigated thoroughly with sterile saline solution before being closed using 4-0 Prolene interrupted sutures. A total of 10 cc of 0.5% plain Sensorcaine was injected in and around the incision before a sterile bulky dressing was applied to the wound. The patient was placed into a volar wrist splint before being awakened, extubated, and returned to the recovery room in satisfactory condition after tolerating the procedure well.

## 2021-06-18 NOTE — Anesthesia Procedure Notes (Signed)
Procedure Name: LMA Insertion Date/Time: 06/18/2021 1:30 PM Performed by: Reece Agar, CRNA Pre-anesthesia Checklist: Patient identified, Emergency Drugs available, Suction available and Patient being monitored Patient Re-evaluated:Patient Re-evaluated prior to induction Oxygen Delivery Method: Circle system utilized Preoxygenation: Pre-oxygenation with 100% oxygen Induction Type: IV induction Ventilation: Mask ventilation without difficulty LMA: LMA inserted LMA Size: 3.5 Number of attempts: 1 Placement Confirmation: positive ETCO2 and breath sounds checked- equal and bilateral Tube secured with: Tape Dental Injury: Teeth and Oropharynx as per pre-operative assessment

## 2021-06-18 NOTE — Anesthesia Preprocedure Evaluation (Signed)
Anesthesia Evaluation  Patient identified by MRN, date of birth, ID band Patient awake    Reviewed: Allergy & Precautions, H&P , NPO status , Patient's Chart, lab work & pertinent test results, reviewed documented beta blocker date and time   History of Anesthesia Complications Negative for: history of anesthetic complications  Airway Mallampati: III  TM Distance: >3 FB Neck ROM: full    Dental  (+) Dental Advidsory Given, Teeth Intact, Missing   Pulmonary neg pulmonary ROS, former smoker,    Pulmonary exam normal breath sounds clear to auscultation       Cardiovascular Exercise Tolerance: Good (-) hypertension (gestational, but no longer pregnant)(-) Past MI and (-) Cardiac Stents negative cardio ROS Normal cardiovascular exam(-) dysrhythmias (-) Valvular Problems/Murmurs Rhythm:regular Rate:Normal     Neuro/Psych negative neurological ROS  negative psych ROS   GI/Hepatic Neg liver ROS, GERD  ,  Endo/Other  negative endocrine ROS  Renal/GU negative Renal ROS  negative genitourinary   Musculoskeletal   Abdominal   Peds  Hematology negative hematology ROS (+)   Anesthesia Other Findings Past Medical History: No date: Asymptomatic varicose veins of left lower extremity No date: Axillary hidradenitis suppurativa No date: Bilateral carpal tunnel syndrome     Comment:  a.) (+) associated paraesthesias to BILATERAL hands No date: Hypertension     Comment:  blood pressure up and down since pregnancy No date: Joint pain No date: Migraines No date: Raynaud's disease without gangrene   Reproductive/Obstetrics negative OB ROS                             Anesthesia Physical Anesthesia Plan  ASA: 2  Anesthesia Plan: General   Post-op Pain Management:    Induction: Intravenous  PONV Risk Score and Plan: 3 and Ondansetron, Dexamethasone, Midazolam, Treatment may vary due to age or medical  condition and Promethazine  Airway Management Planned: LMA  Additional Equipment:   Intra-op Plan:   Post-operative Plan: Extubation in OR  Informed Consent: I have reviewed the patients History and Physical, chart, labs and discussed the procedure including the risks, benefits and alternatives for the proposed anesthesia with the patient or authorized representative who has indicated his/her understanding and acceptance.     Dental Advisory Given  Plan Discussed with: Anesthesiologist, CRNA and Surgeon  Anesthesia Plan Comments:         Anesthesia Quick Evaluation

## 2021-06-19 NOTE — Anesthesia Postprocedure Evaluation (Signed)
Anesthesia Post Note  Patient: Theresa Davidson  Procedure(s) Performed: CARPAL TUNNEL RELEASE ENDOSCOPIC (Left: Shoulder)  Patient location during evaluation: PACU Anesthesia Type: General Level of consciousness: awake and alert Pain management: pain level controlled Vital Signs Assessment: post-procedure vital signs reviewed and stable Respiratory status: spontaneous breathing, nonlabored ventilation, respiratory function stable and patient connected to nasal cannula oxygen Cardiovascular status: blood pressure returned to baseline and stable Postop Assessment: no apparent nausea or vomiting Anesthetic complications: no   No notable events documented.   Last Vitals:  Vitals:   06/18/21 1445 06/18/21 1507  BP: 114/60 134/70  Pulse: 62 80  Resp: 16 16  Temp:    SpO2: 100% 100%    Last Pain:  Vitals:   06/18/21 1507  TempSrc:   PainSc: 0-No pain                 Lenard Simmer

## 2022-10-06 ENCOUNTER — Other Ambulatory Visit: Payer: Self-pay | Admitting: Certified Nurse Midwife

## 2022-10-06 DIAGNOSIS — Z1231 Encounter for screening mammogram for malignant neoplasm of breast: Secondary | ICD-10-CM

## 2022-11-13 ENCOUNTER — Other Ambulatory Visit: Payer: Self-pay | Admitting: Certified Nurse Midwife

## 2022-11-13 DIAGNOSIS — N6323 Unspecified lump in the left breast, lower outer quadrant: Secondary | ICD-10-CM

## 2022-11-28 ENCOUNTER — Ambulatory Visit
Admission: RE | Admit: 2022-11-28 | Discharge: 2022-11-28 | Disposition: A | Discharge status: Home / Self Care | Payer: Commercial Managed Care - PPO | Source: Ambulatory Visit | Attending: Certified Nurse Midwife | Admitting: Certified Nurse Midwife

## 2022-11-28 ENCOUNTER — Other Ambulatory Visit: Payer: Self-pay | Admitting: Certified Nurse Midwife

## 2022-11-28 DIAGNOSIS — N6323 Unspecified lump in the left breast, lower outer quadrant: Secondary | ICD-10-CM

## 2022-11-28 DIAGNOSIS — R928 Other abnormal and inconclusive findings on diagnostic imaging of breast: Secondary | ICD-10-CM

## 2022-11-28 DIAGNOSIS — N63 Unspecified lump in unspecified breast: Secondary | ICD-10-CM

## 2022-12-05 ENCOUNTER — Other Ambulatory Visit: Payer: Self-pay | Admitting: Obstetrics and Gynecology

## 2022-12-05 DIAGNOSIS — N63 Unspecified lump in unspecified breast: Secondary | ICD-10-CM

## 2022-12-05 DIAGNOSIS — R928 Other abnormal and inconclusive findings on diagnostic imaging of breast: Secondary | ICD-10-CM

## 2022-12-11 ENCOUNTER — Ambulatory Visit
Admission: RE | Admit: 2022-12-11 | Discharge: 2022-12-11 | Disposition: A | Discharge status: Home / Self Care | Payer: Commercial Managed Care - PPO | Source: Ambulatory Visit | Attending: Certified Nurse Midwife | Admitting: Certified Nurse Midwife

## 2022-12-11 DIAGNOSIS — N63 Unspecified lump in unspecified breast: Secondary | ICD-10-CM | POA: Diagnosis present

## 2022-12-11 DIAGNOSIS — R928 Other abnormal and inconclusive findings on diagnostic imaging of breast: Secondary | ICD-10-CM | POA: Insufficient documentation

## 2022-12-11 HISTORY — PX: BREAST BIOPSY: SHX20

## 2022-12-11 MED ORDER — LIDOCAINE HCL 1 % IJ SOLN
2.0000 mL | Freq: Once | INTRAMUSCULAR | Status: AC
  Administered 2022-12-11: 2 mL via INTRADERMAL
  Filled 2022-12-11: qty 2

## 2022-12-11 MED ORDER — LIDOCAINE-EPINEPHRINE 1 %-1:100000 IJ SOLN
5.0000 mL | Freq: Once | INTRAMUSCULAR | Status: AC
  Administered 2022-12-11: 5 mL
  Filled 2022-12-11: qty 5

## 2022-12-17 ENCOUNTER — Encounter: Payer: Self-pay | Admitting: *Deleted

## 2022-12-17 NOTE — Progress Notes (Signed)
Referral recieved from [facility] Radiology for benign breast mass.  Appointment scheduled for surgical consultation with Dr. Tonna Boehringer on 7/9 at 8:00 am.   No further needs at this time.

## 2023-05-15 ENCOUNTER — Other Ambulatory Visit: Payer: Self-pay | Admitting: Surgery

## 2023-05-15 DIAGNOSIS — D242 Benign neoplasm of left breast: Secondary | ICD-10-CM

## 2023-06-23 ENCOUNTER — Ambulatory Visit
Admission: RE | Admit: 2023-06-23 | Discharge: 2023-06-23 | Disposition: A | Discharge status: Home / Self Care | Payer: Commercial Managed Care - PPO | Source: Ambulatory Visit | Attending: Surgery | Admitting: Surgery

## 2023-06-23 ENCOUNTER — Encounter: Payer: Self-pay | Admitting: Radiology

## 2023-06-23 DIAGNOSIS — D242 Benign neoplasm of left breast: Secondary | ICD-10-CM | POA: Insufficient documentation

## 2023-08-05 ENCOUNTER — Encounter: Payer: Self-pay | Admitting: Emergency Medicine

## 2023-08-05 ENCOUNTER — Ambulatory Visit
Admission: EM | Admit: 2023-08-05 | Discharge: 2023-08-05 | Disposition: A | Discharge status: Home / Self Care | Payer: Commercial Managed Care - PPO

## 2023-08-05 DIAGNOSIS — J111 Influenza due to unidentified influenza virus with other respiratory manifestations: Secondary | ICD-10-CM

## 2023-08-05 DIAGNOSIS — R509 Fever, unspecified: Secondary | ICD-10-CM

## 2023-08-05 DIAGNOSIS — G43909 Migraine, unspecified, not intractable, without status migrainosus: Secondary | ICD-10-CM | POA: Insufficient documentation

## 2023-08-05 DIAGNOSIS — T7840XA Allergy, unspecified, initial encounter: Secondary | ICD-10-CM | POA: Insufficient documentation

## 2023-08-05 DIAGNOSIS — J069 Acute upper respiratory infection, unspecified: Secondary | ICD-10-CM | POA: Diagnosis not present

## 2023-08-05 LAB — POC COVID19/FLU A&B COMBO
Covid Antigen, POC: NEGATIVE
Influenza A Antigen, POC: NEGATIVE
Influenza B Antigen, POC: NEGATIVE

## 2023-08-05 MED ORDER — IBUPROFEN 600 MG PO TABS: 600.0000 mg | ORAL_TABLET | Freq: Three times a day (TID) | ORAL | 0 refills | Status: DC | PRN

## 2023-08-05 MED ORDER — PROMETHAZINE-DM 6.25-15 MG/5ML PO SYRP: 5.0000 mL | ORAL_SOLUTION | Freq: Three times a day (TID) | ORAL | 0 refills | Status: DC | PRN

## 2023-08-05 MED ORDER — ACETAMINOPHEN 325 MG PO TABS
975.0000 mg | ORAL_TABLET | Freq: Once | ORAL | Status: AC
  Administered 2023-08-05: 975 mg via ORAL

## 2023-08-05 MED ORDER — IBUPROFEN 800 MG PO TABS
800.0000 mg | ORAL_TABLET | Freq: Once | ORAL | Status: AC
  Administered 2023-08-05: 800 mg via ORAL

## 2023-08-05 MED ORDER — OSELTAMIVIR PHOSPHATE 75 MG PO CAPS: 75.0000 mg | ORAL_CAPSULE | Freq: Two times a day (BID) | ORAL | 0 refills | Status: DC

## 2023-08-05 NOTE — Discharge Instructions (Signed)
You tested negative for flu and COVID, however, I believe that your flu test is falsely negative.  Start Tamiflu twice daily for 5 days.  This can cause upset stomach so if you develop any side effects stop the medication to be seen immediately.  Take ibuprofen 600 mg every 6-8 hours as needed.  Do not take additional NSAIDs with this medication including aspirin, ibuprofen/Advil, naproxen/Aleve.  Use Promethazine DM for cough.  This will make you sleepy so do not drive drink alcohol with taking it.  Make sure that you rest and drink plenty of fluids.  If your symptoms are not improving within a few days or if anything worsens and you have high fever not responding to medication, chest pain, shortness of breath, nausea/vomiting interfering with oral intake, weakness you need to be seen immediately.

## 2023-08-05 NOTE — ED Provider Notes (Signed)
[facility]    CSN: [id number] Arrival date & time: 08/05/23  0801      History   Chief Complaint Chief Complaint  Patient presents with   Fever   Generalized Body Aches   Nasal Congestion    HPI Theresa Davidson is a 43 y.o. female.   Patient presents today with a 12-hour history of URI symptoms.  Reports fever, body aches, chills, congestion, sore throat, nausea.  Denies any diarrhea, vomiting, chest pain, shortness of breath.  She has not taken any over-the-counter medication for symptom management.  She has had COVID several years ago.  She has not had most recent COVID-vaccine.  Reports her son was sick with influenza and so she was at the hospital over the weekend and could have been exposed to something there.  Denies any recent antibiotics or steroids.  She denies any history of asthma, COPD, smoking.  She has no concern for pregnancy.    Past Medical History:  Diagnosis Date   Asymptomatic varicose veins of left lower extremity    Axillary hidradenitis suppurativa    Bilateral carpal tunnel syndrome    a.) (+) associated paraesthesias to BILATERAL hands   Hypertension    blood pressure up and down since pregnancy   Joint pain    Migraines    Raynaud's disease without gangrene     Patient Active Problem List   Diagnosis Date Noted   Allergy 08/05/2023   Migraine 08/05/2023   Asymptomatic varicose veins of left lower extremity 03/14/2019   Bilateral carpal tunnel syndrome 03/10/2018   Gestational hypertension w/o significant proteinuria in 3rd trimester 01/25/2018   Gestational hypertension, antepartum 01/23/2018   Elevated BP without diagnosis of hypertension 01/21/2018   Abnormal maternal serum screening test 01/15/2018   Pregnancy 11/27/2017   AMA (advanced maternal age) multigravida 35+, second trimester 09/29/2017   Infection of genitourinary tract antepartum 09/29/2017   Raynaud's phenomenon without gangrene 10/01/2016   Neck pain 04/02/2016    Abnormal bone radiograph 11/28/2015   Arthralgia of multiple joints 11/14/2015   Cervicogenic headache 10/31/2013   OSA (obstructive sleep apnea) 10/31/2013    Past Surgical History:  Procedure Laterality Date   BREAST BIOPSY Left 12/11/2022   [place] Core Bx Coil clip- path pending   BREAST BIOPSY Left 12/11/2022   [place] LT BREAST BX W LOC DEV 1ST LESION IMG BX SPEC US GUIDE 12/11/2022 [facility]   CARPAL TUNNEL RELEASE Right 06/30/2018   Procedure: CARPAL TUNNEL RELEASE ENDOSCOPIC;  Surgeon: Christena Flake, MD;  Location: [facility];  Service: Orthopedics;  Laterality: Right;   CARPAL TUNNEL RELEASE Left 06/18/2021   Procedure: CARPAL TUNNEL RELEASE ENDOSCOPIC;  Surgeon: Christena Flake, MD;  Location: [facility];  Service: Orthopedics;  Laterality: Left;    OB History     Gravida  1   Para  1   Term  1   Preterm      AB      Living  1      SAB      IAB      Ectopic      Multiple  0   Live Births  1            Home Medications    Prior to Admission medications   Medication Sig Start Date End Date Taking? Authorizing Provider  ibuprofen (ADVIL) 600 MG tablet Take 1 tablet (600 mg total) by mouth every 8 (eight) hours as needed. 08/05/23  Yes  Seddrick Flax, Noberto Retort, PA-C  oseltamivir (TAMIFLU) 75 MG capsule Take 1 capsule (75 mg total) by mouth every 12 (twelve) hours. 08/05/23  Yes Koichi Platte K, PA-C  promethazine-dextromethorphan (PROMETHAZINE-DM) 6.25-15 MG/5ML syrup Take 5 mLs by mouth 3 (three) times daily as needed for cough. 08/05/23  Yes Jin Shockley, Noberto Retort, PA-C  levonorgestrel (LILETTA) 20.1 MCG/DAY IUD IUD by Intrauterine route.   Yes [provider]  topiramate ER (QUDEXY XR) 100 MG CS24 sprinkle capsule Take by mouth. Patient not taking: Reported on 08/05/2023    [provider]  TROKENDI XR 200 MG CP24 Take 200 mg by mouth daily. Patient not taking: Reported on 08/05/2023 06/04/21   [provider]  zonisamide (ZONEGRAN) 100 MG  capsule Take 100 mg by mouth at bedtime.   Yes [provider]    Family History Family History  Problem Relation Age of Onset   Breast cancer Neg Hx     Social History Social History   Tobacco Use   Smoking status: Former    Types: Cigarettes   Smokeless tobacco: Never  Vaping Use   Vaping status: Never Used  Substance Use Topics   Alcohol use: Not Currently   Drug use: No     Allergies   Penicillins   Review of Systems Review of Systems  Constitutional:  Positive for activity change, chills, fatigue and fever. Negative for appetite change.  HENT:  Positive for congestion, postnasal drip and sore throat. Negative for sinus pressure and sneezing.   Respiratory:  Negative for cough and shortness of breath.   Cardiovascular:  Negative for chest pain.  Gastrointestinal:  Positive for nausea. Negative for abdominal pain, diarrhea and vomiting.  Musculoskeletal:  Positive for arthralgias and myalgias.  Neurological:  Positive for headaches.     Physical Exam Triage Vital Signs ED Triage Vitals  Encounter Vitals Group     BP 08/05/23 0814 139/82     Systolic BP Percentile --      Diastolic BP Percentile --      Pulse Rate 08/05/23 0814 (!) 134     Resp 08/05/23 0814 16     Temp 08/05/23 0814 99.5 F (37.5 C)     Temp Source 08/05/23 0814 Oral     SpO2 08/05/23 0814 98 %     Weight --      Height --      Head Circumference --      Peak Flow --      Pain Score 08/05/23 0815 3     Pain Loc --      Pain Education --      Exclude from Growth Chart --    No data found.  Updated Vital Signs BP 139/82 (BP Location: Left Arm)   Pulse (!) 115   Temp 99.7 F (37.6 C) (Oral)   Resp 16   LMP  (LMP Unknown)   SpO2 97%   Visual Acuity Right Eye Distance:   Left Eye Distance:   Bilateral Distance:    Right Eye Near:   Left Eye Near:    Bilateral Near:     Physical Exam Vitals reviewed.  Constitutional:      General: She is awake. She is not in  acute distress.    Appearance: Normal appearance. She is well-developed. She is not ill-appearing.     Comments: Very pleasant female appears stated age in no acute distress sitting comfortably in exam room  HENT:     Head: Normocephalic and  atraumatic.     Right Ear: Tympanic membrane, ear canal and external ear normal. Tympanic membrane is not erythematous or bulging.     Left Ear: Tympanic membrane, ear canal and external ear normal. Tympanic membrane is not erythematous or bulging.     Nose:     Right Sinus: No maxillary sinus tenderness or frontal sinus tenderness.     Left Sinus: No maxillary sinus tenderness or frontal sinus tenderness.     Mouth/Throat:     Pharynx: Uvula midline. Postnasal drip present. No oropharyngeal exudate or posterior oropharyngeal erythema.  Cardiovascular:     Rate and Rhythm: Normal rate and regular rhythm.     Heart sounds: Normal heart sounds, S1 normal and S2 normal. No murmur heard. Pulmonary:     Effort: Pulmonary effort is normal.     Breath sounds: Normal breath sounds. No wheezing, rhonchi or rales.     Comments: Clear to auscultation bilaterally Psychiatric:        Behavior: Behavior is cooperative.      UC Treatments / Results  Labs (all labs ordered are listed, but only abnormal results are displayed) Labs Reviewed  POC COVID19/FLU A&B COMBO - Normal    EKG   Radiology No results found.  Procedures Procedures (including critical care time)  Medications Ordered in UC Medications  acetaminophen (TYLENOL) tablet 975 mg (975 mg Oral Given 08/05/23 0845)  ibuprofen (ADVIL) tablet 800 mg (800 mg Oral Given 08/05/23 0904)    Initial Impression / Assessment and Plan / UC Course  I have reviewed the triage vital signs and the nursing notes.  Pertinent labs & imaging results that were available during my care of the patient were reviewed by me and considered in my medical decision making (see chart for details).     Patient is  well-appearing, afebrile, nontoxic.  She was initially tachycardic up to 130 but I suspect this is because she is sick and developing fever.  She was given antipyretics in clinic and her temperature increased but remained afebrile and her heart rate improved.  She was encouraged to rest and drink plenty of fluids.  She was tested for COVID and flu and was negative for both.  I suspect that her influenza testing was falsely negative so will empirically treat with Tamiflu.  We discussed that this can upset her stomach and if she develops any side effects she should stop the medication to be seen immediately.  She can use over-the-counter medication for additional symptom relief.  She was given ibuprofen 600 mg to help with body aches and fever.  Discussed that she is not to take additional NSAIDs with this medication but can use Tylenol as needed.  She was given Promethazine DM for cough.  We discussed that this can be sedating and she is not to drive drink alcohol while taking it.  Recommended that she rest and drink plenty of fluid.  If her symptoms are not improving within a few days or if anything worsens she needs to be seen immediately.  Strict return precautions given.  Excuse note provided.  Final Clinical Impressions(s) / UC Diagnoses   Final diagnoses:  Upper respiratory tract infection, unspecified type  Fever, unspecified  Influenza-like illness     Discharge Instructions      You tested negative for flu and COVID, however, I believe that your flu test is falsely negative.  Start Tamiflu twice daily for 5 days.  This can cause upset stomach so if you develop  any side effects stop the medication to be seen immediately.  Take ibuprofen 600 mg every 6-8 hours as needed.  Do not take additional NSAIDs with this medication including aspirin, ibuprofen/Advil, naproxen/Aleve.  Use Promethazine DM for cough.  This will make you sleepy so do not drive drink alcohol with taking it.  Make sure that you  rest and drink plenty of fluids.  If your symptoms are not improving within a few days or if anything worsens and you have high fever not responding to medication, chest pain, shortness of breath, nausea/vomiting interfering with oral intake, weakness you need to be seen immediately.     ED Prescriptions     Medication Sig Dispense Auth. Provider   ibuprofen (ADVIL) 600 MG tablet Take 1 tablet (600 mg total) by mouth every 8 (eight) hours as needed. 30 tablet Naod Sweetland K, PA-C   promethazine-dextromethorphan (PROMETHAZINE-DM) 6.25-15 MG/5ML syrup Take 5 mLs by mouth 3 (three) times daily as needed for cough. 118 mL Mandy Fitzwater K, PA-C   oseltamivir (TAMIFLU) 75 MG capsule Take 1 capsule (75 mg total) by mouth every 12 (twelve) hours. 10 capsule Gregg Holster, Noberto Retort, PA-C      PDMP not reviewed this encounter.   Jeani Hawking, PA-C 08/05/23 1610

## 2023-08-05 NOTE — ED Triage Notes (Addendum)
Pt reports fever, body aches, chills, congestion, and mild sore throat that started last night. Max temp at home: 100.4. Little relief with mucinex.

## 2023-10-19 ENCOUNTER — Other Ambulatory Visit: Payer: Self-pay | Admitting: Surgery

## 2023-10-19 DIAGNOSIS — D242 Benign neoplasm of left breast: Secondary | ICD-10-CM

## 2023-11-03 NOTE — Progress Notes (Unsigned)
 Gynecologic Oncology Consult Visit   Referring Provider: Dr. Cleora Daft  Chief Complaint: Endocervical adenocarcinoma in situ Subjective:  Theresa Davidson is a 43 y.o. G1P1 female who is seen in consultation from Dr. Cleora Daft for cervical adenocarcinoma in situ.   09/16/23 seen for annual Gyn exam.  No bleeding or discharge.    Pap History:  2016- NILM, hpv not performed 09/08/2017- NILM, hpv not performed 12/10/2020- NILM, HPV Positive (12 mo f/u rec) 09/16/23- Endocervical adenocarcinoma in situ, HPV Positive. Genotype not performed  10/19/23- Cervix was reddened, lobular, irregular surface exophytic lesion w/o regular borders emanating from edge of cervix. Blue outline is more regular shaped lesion similar in appearance. Colposcopy performed with aceto-white lesions at 10-11, 8, 5:00. Biopsies performed. ECC performed.   Cervical Biopsy, 2:00: ENDOCERVICAL ADENOCARCINOMA-IN-SITU.  Cervical Biopsy, 5:00: LOW-GRADE SQUAMOUS INTRAEPITHELIAL LESION (CIN 1).  Cervical Biopsy, 8:00: ENDOCERVICAL ADENOCARCINOMA-IN-SITU.  Cervical Biopsy,10:00: ENDOCERVICAL ADENOCARCINOMA-IN-SITU.  Endocervical Curettings: ENDOCERVICAL ADENOCARCINOMA-IN-SITU.   PAP 2022 negative, HPV HR+   PAP 3/19 normal   IUD for contraception  Problem List: Patient Active Problem List   Diagnosis Date Noted   Allergy 08/05/2023   Migraine 08/05/2023   Asymptomatic varicose veins of left lower extremity 03/14/2019   Bilateral carpal tunnel syndrome 03/10/2018   Gestational hypertension w/o significant proteinuria in 3rd trimester 01/25/2018   Gestational hypertension, antepartum 01/23/2018   Elevated BP without diagnosis of hypertension 01/21/2018   Abnormal maternal serum screening test 01/15/2018   Pregnancy 11/27/2017   AMA (advanced maternal age) multigravida 35+, second trimester 09/29/2017   Infection of genitourinary tract antepartum 09/29/2017   Raynaud's phenomenon without gangrene 10/01/2016   Neck pain  04/02/2016   Abnormal bone radiograph 11/28/2015   Arthralgia of multiple joints 11/14/2015   Cervicogenic headache 10/31/2013   OSA (obstructive sleep apnea) 10/31/2013    Past Medical History: Past Medical History:  Diagnosis Date   Asymptomatic varicose veins of left lower extremity    Axillary hidradenitis suppurativa    Bilateral carpal tunnel syndrome    a.) (+) associated paraesthesias to BILATERAL hands   Hypertension    blood pressure up and down since pregnancy   Joint pain    Migraines    Raynaud's disease without gangrene     Past Surgical History: Past Surgical History:  Procedure Laterality Date   BREAST BIOPSY Left 12/11/2022   US  Core Bx Coil clip- path pending   BREAST BIOPSY Left 12/11/2022   US  LT BREAST BX W LOC DEV 1ST LESION IMG BX SPEC US  GUIDE 12/11/2022 [facility]   CARPAL TUNNEL RELEASE Right 06/30/2018   Procedure: CARPAL TUNNEL RELEASE ENDOSCOPIC;  Surgeon: Elner Hahn, MD;  Location: [facility];  Service: Orthopedics;  Laterality: Right;   CARPAL TUNNEL RELEASE Left 06/18/2021   Procedure: CARPAL TUNNEL RELEASE ENDOSCOPIC;  Surgeon: Elner Hahn, MD;  Location: [facility];  Service: Orthopedics;  Laterality: Left;    Past Gynecologic History:  Menarche: age 43, irregular with spotting Sexually active: yes, female.  Contraception: liletta IUD placed 07/30/18 Denies hx of STI.    OB History:  OB History  Gravida Para Term Preterm AB Living  1 1 1   1   SAB IAB Ectopic Multiple Live Births     0 1    # Outcome Date GA Lbr Len/2nd Weight Sex Type Anes PTL Lv  1 Term 01/26/18 [redacted]w[redacted]d / 02:42 6 lb 9.8 oz (3 kg) M Vag-Spont EPI  LIV    Family History: Family History  Problem Relation Age of Onset   Breast cancer Neg Hx     Social History: Social History   Socioeconomic History   Marital status: Single    Spouse name: Not on file   Number of children: Not on file   Years of education: Not on file   Highest education level:  Not on file  Occupational History   Not on file  Tobacco Use   Smoking status: Former    Types: Cigarettes   Smokeless tobacco: Never  Vaping Use   Vaping status: Never Used  Substance and Sexual Activity   Alcohol use: Not Currently   Drug use: No   Sexual activity: Yes    Birth control/protection: I.U.D.  Other Topics Concern   Not on file  Social History Narrative   Not on file   Social Drivers of Health   Financial Resource Strain: Low Risk  (10/21/2023)   Received from [facility]   Overall Financial Resource Strain (CARDIA)    Difficulty of Paying Living Expenses: Not very hard  Food Insecurity: No Food Insecurity (10/21/2023)   Received from [facility]   Hunger Vital Sign    Worried About Running Out of Food in the Last Year: Never true    Ran Out of Food in the Last Year: Never true  Transportation Needs: No Transportation Needs (10/21/2023)   Received from [facility]    In the past 12 months, has lack of transportation kept you from medical appointments or from getting medications?: No    Lack of Transportation (Non-Medical): No  Physical Activity: Not on file  Stress: Not on file  Social Connections: Not on file  Intimate Partner Violence: Not on file    Allergies: Allergies  Allergen Reactions   Penicillins Other (See Comments)    Doesn't know symptoms last had it as a child  As a child    Current Medications: Current Outpatient Medications  Medication Sig Dispense Refill   ibuprofen  (ADVIL ) 600 MG tablet Take 1 tablet (600 mg total) by mouth every 8 (eight) hours as needed. 30 tablet 0   levonorgestrel (LILETTA) 20.1 MCG/DAY IUD IUD by Intrauterine route.     oseltamivir  (TAMIFLU ) 75 MG capsule Take 1 capsule (75 mg total) by mouth every 12 (twelve) hours. 10 capsule 0   promethazine -dextromethorphan (PROMETHAZINE -DM) 6.25-15 MG/5ML syrup Take 5 mLs by mouth 3 (three)  times daily as needed for cough. 118 mL 0   topiramate ER (QUDEXY XR) 100 MG CS24 sprinkle capsule Take by mouth. (Patient not taking: Reported on 08/05/2023)     TROKENDI XR 200 MG CP24 Take 200 mg by mouth daily. (Patient not taking: Reported on 08/05/2023)     zonisamide (ZONEGRAN) 100 MG capsule Take 100 mg by mouth at bedtime.     No current facility-administered medications for this visit.    Review of Systems General: negative for, fevers, chills, fatigue, changes in sleep, changes in weight or appetite Skin: negative for changes in color, texture, moles or lesions Eyes: negative for, changes in vision, pain, diplopia HEENT: negative for, change in hearing, pain, discharge, tinnitus, vertigo, voice changes, sore throat, neck masses Pulmonary: negative for, dyspnea, orthopnea, productive cough Cardiac: negative for, palpitations, syncope, pain, discomfort, pressure Gastrointestinal: negative for, dysphagia, nausea, vomiting, jaundice, pain, constipation, diarrhea, hematemesis, hematochezia Genitourinary/Sexual: negative for, dysuria, discharge, hesitancy, nocturia, retention, stones, infections, STD's, incontinence Musculoskeletal: negative for, pain, stiffness, swelling, range of motion  limitation Hematology: negative for, easy bruising, bleeding Neurologic/Psych: negative for, headaches, seizures, paralysis, weakness, tremor, change in gait, change in sensation, mood swings, depression, anxiety, change in memory  Objective:  Physical Examination:  There were no vitals taken for this visit.    ECOG Performance Status: 0 - Asymptomatic  General appearance: alert, cooperative, and appears stated age HEENT: mucus membranes moist no mucosal lesions posterior pharynx benign PERRL EOMI sclera clear Neck: no thyroid enlargement or cervical adenopathy Lymph node survey: non-palpable, axillary, inguinal, supraclavicular Cardiovascular: without murmurs, rubs or gallops Respiratory:  clear to auscultation Abdomen: no palpable masses, no hernias, well healed incision, soft, nontender, nondistended, bowel sounds present, and without hepatosplenomegaly Back: inspection of back is normal Extremities: no lower extremity edema Skin exam - normal coloration and turgor, no rashes, no suspicious skin lesions noted. Neurological exam reveals alert, oriented, normal speech, no focal findings or movement disorder noted.  Pelvic: Exam chaperoned by  EGBUS: no lesions Cervix: reddish lesion anteriorly, feels normal, nontender, mobile. IUD string seen.  Vagina: no lesions, no discharge or bleeding Uterus: normal size, nontender, mobile Adnexa: no palpable masses Rectovaginal: deferred  Colposcopy done with acetic acid and slightly raised lesion seen from 9-3 o'clock with punctation and mosaicism.  Does not bleed though.    Lab Review Labs on site today: Lab Results  Component Value Date   WBC 12.5 (H) 01/27/2018   HGB 11.8 (L) 01/27/2018   HCT 34.6 (L) 01/27/2018   MCV 88.5 01/27/2018   PLT 145 (L) 01/27/2018     Chemistry      Component Value Date/Time   NA 140 01/27/2018 0516   K 3.8 01/27/2018 0516   CL 112 (H) 01/27/2018 0516   CO2 23 01/27/2018 0516   BUN 8 01/27/2018 0516   CREATININE 0.43 (L) 01/27/2018 0516      Component Value Date/Time   CALCIUM 8.1 (L) 01/27/2018 0516   ALKPHOS 138 (H) 01/27/2018 0516   AST 24 01/27/2018 0516   ALT 14 01/27/2018 0516   BILITOT 0.6 01/27/2018 0516         Assessment:  Theresa Davidson is a 43 y.o. 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   Medical co-morbidities complicating care: NTN, Raynaud's.  Plan:   Problem List Items Addressed This Visit   None Visit Diagnoses       Adenocarcinoma in situ of endocervix    -  Primary      We discussed options for management including cone biopsy to rule our invasion and to remove the lesion.  She does not desire future fertility.  Discussed with Dr  Cleora Daft and he will perform cone biopsy next week.  Patient is in agreement with proceeding.    Discussed with patient that if the cone shows just AIS or minimal invasion that a simple hysterectomy can be done after the cervix is healed from the cone in about 2 months.  If there is more extensive invasion than a radical hysterectomy and nodal assessment would be indicated, but doubt this will be the case.  Also discussed that with AIS she could preserve her uterus if cone margins were negative, but she is not interested in future fertility.   The patient's diagnosis, an outline of the further diagnostic and laboratory studies which will be required, the recommendation for surgery, and alternatives were discussed with her and her accompanying family members.  All questions were answered to their satisfaction.  A total of 40 minutes were spent  with the patient/family today; 50% was spent in education, counseling and coordination of care for AIS.    Nelda Balsam, NP  I personally interviewed and examined the patient. Agreed with the above/below plan of care. I have directly contributed to assessment and plan of care of this patient and educated and discussed with patient and family.  Hermine Loots, MD  CC:  Kris Pester, MD [address] [place],  [place] [address] [phone]

## 2023-11-04 ENCOUNTER — Inpatient Hospital Stay

## 2023-11-04 ENCOUNTER — Inpatient Hospital Stay: Attending: Obstetrics and Gynecology | Admitting: Obstetrics and Gynecology

## 2023-11-04 ENCOUNTER — Encounter: Payer: Self-pay | Admitting: Obstetrics and Gynecology

## 2023-11-04 VITALS — BP 153/91 | HR 72 | Resp 20 | Wt 215.5 lb

## 2023-11-04 DIAGNOSIS — Z79899 Other long term (current) drug therapy: Secondary | ICD-10-CM | POA: Diagnosis not present

## 2023-11-04 DIAGNOSIS — A630 Anogenital (venereal) warts: Secondary | ICD-10-CM | POA: Insufficient documentation

## 2023-11-04 DIAGNOSIS — D060 Carcinoma in situ of endocervix: Secondary | ICD-10-CM | POA: Diagnosis not present

## 2023-11-04 DIAGNOSIS — Z87891 Personal history of nicotine dependence: Secondary | ICD-10-CM | POA: Diagnosis not present

## 2023-11-04 DIAGNOSIS — Z793 Long term (current) use of hormonal contraceptives: Secondary | ICD-10-CM | POA: Diagnosis not present

## 2023-11-04 DIAGNOSIS — I7300 Raynaud's syndrome without gangrene: Secondary | ICD-10-CM | POA: Insufficient documentation

## 2023-11-04 DIAGNOSIS — G4733 Obstructive sleep apnea (adult) (pediatric): Secondary | ICD-10-CM | POA: Diagnosis not present

## 2023-12-01 ENCOUNTER — Ambulatory Visit
Admission: RE | Admit: 2023-12-01 | Discharge: 2023-12-01 | Disposition: A | Discharge status: Home / Self Care | Source: Ambulatory Visit | Attending: Surgery | Admitting: Surgery

## 2023-12-01 DIAGNOSIS — D242 Benign neoplasm of left breast: Secondary | ICD-10-CM | POA: Insufficient documentation

## 2023-12-02 ENCOUNTER — Encounter
Admission: RE | Admit: 2023-12-02 | Discharge: 2023-12-02 | Disposition: A | Discharge status: Home / Self Care | Source: Ambulatory Visit | Attending: Obstetrics and Gynecology | Admitting: Obstetrics and Gynecology

## 2023-12-02 ENCOUNTER — Other Ambulatory Visit: Payer: Self-pay

## 2023-12-02 DIAGNOSIS — Z01812 Encounter for preprocedural laboratory examination: Secondary | ICD-10-CM

## 2023-12-02 HISTORY — DX: Carcinoma in situ of endocervix: D06.0

## 2023-12-02 NOTE — Patient Instructions (Addendum)
 Your procedure is scheduled on: 12/09/23 - Wednesday Report to the Registration Desk on the 1st floor of the [facility]. To find out your arrival time, please call [phone] between 1PM - 3PM on: 12/08/23 - Tuesday If your arrival time is 6:00 am, do not arrive before that time as the [facility] entrance doors do not open until 6:00 am.  REMEMBER: Instructions that are not followed completely may result in serious medical risk, up to and including death; or upon the discretion of your surgeon and anesthesiologist your surgery may need to be rescheduled.  Do not eat food after midnight the night before surgery.  No gum chewing or hard candies.  You may however, drink CLEAR liquids up to 2 hours before you are scheduled to arrive for your surgery. Do not drink anything within 2 hours of your scheduled arrival time.  Clear liquids include: - water  - apple juice without pulp - gatorade (not RED colors) - black coffee or tea (Do NOT add milk or creamers to the coffee or tea) Do NOT drink anything that is not on this list.   One week prior to surgery: Stop Anti-inflammatories (NSAIDS) such as  Mobic , Advil , Aleve, Ibuprofen , Motrin , Naproxen, Naprosyn and Aspirin based products such as Excedrin, Goody's Powder, BC Powder. You may take Tylenol  if needed for pain up until the day of surgery.  Stop ANY OVER THE COUNTER supplements until after surgery.  Continue taking all of your other prescription medications up until the day before surgery.  ON THE DAY OF SURGERY ONLY TAKE THESE MEDICATIONS WITH SIPS OF WATER:  none   No Alcohol for 24 hours before or after surgery.  No Smoking including e-cigarettes for 24 hours before surgery.  No chewable tobacco products for at least 6 hours before surgery.  No nicotine patches on the day of surgery.  Do not use any recreational drugs for at least a week (preferably 2 weeks) before your surgery.  Please be advised that the  combination of cocaine and anesthesia may have negative outcomes, up to and including death. If you test positive for cocaine, your surgery will be cancelled.  On the morning of surgery brush your teeth with toothpaste and water, you may rinse your mouth with mouthwash if you wish. Do not swallow any toothpaste or mouthwash.  Do not wear jewelry, make-up, hairpins, clips or nail polish.  For welded (permanent) jewelry: bracelets, anklets, waist bands, etc.  Please have this removed prior to surgery.  If it is not removed, there is a chance that hospital personnel will need to cut it off on the day of surgery.  Do not wear lotions, powders, or perfumes.   Do not shave body hair from the neck down 48 hours before surgery.  Contact lenses, hearing aids and dentures may not be worn into surgery.  Do not bring valuables to the hospital. [facility] is not responsible for any missing/lost belongings or valuables.   Notify your doctor if there is any change in your medical condition (cold, fever, infection).  Wear comfortable clothing (specific to your surgery type) to the hospital.  After surgery, you can help prevent lung complications by doing breathing exercises.  Take deep breaths and cough every 1-2 hours. Your doctor may order a device called an Incentive Spirometer to help you take deep breaths. When coughing or sneezing, hold a pillow firmly against your incision with both hands. This is called "splinting." Doing this helps protect your incision.  It also decreases belly discomfort.  If you are being admitted to the hospital overnight, leave your suitcase in the car. After surgery it may be brought to your room.  In case of increased patient census, it may be necessary for you, the patient, to continue your postoperative care in the Same Day Surgery department.  If you are being discharged the day of surgery, you will not be allowed to drive home. You will need a responsible  individual to drive you home and stay with you for 24 hours after surgery.   If you are taking public transportation, you will need to have a responsible individual with you.  Please call the Pre-admissions Testing Dept. at [phone] if you have any questions about these instructions.  Surgery Visitation Policy:  Patients having surgery or a procedure may have two visitors.  Children under the age of 53 must have an adult with them who is not the patient.  Inpatient Visitation:    Visiting hours are 7 a.m. to 8 p.m. Up to four visitors are allowed at one time in a patient room. The visitors may rotate out with other people during the day.  One visitor age 73 or older may stay with the patient overnight and must be in the room by 8 p.m.

## 2023-12-08 NOTE — H&P (Signed)
 Preoperative History and Physical  Theresa Davidson is a 43 y.o. G1P1001 here for surgical management of adenocarcinoma in situ of cervix.   No significant preoperative concerns.  History of Present Illness: Theresa Davidson is a 43 y.o. G1P1 female who presents for management of cervical adenocarcinoma in situ.    09/16/23 seen for annual Gyn exam.  No bleeding or discharge.     Pap History:  2016- NILM, hpv not performed 09/08/2017- NILM, hpv not performed 12/10/2020- NILM, HPV Positive (12 mo f/u rec) 09/16/23- Endocervical adenocarcinoma in situ, HPV Positive. Genotype not performed   10/19/23- Cervix was reddened, lobular, irregular surface exophytic lesion w/o regular borders emanating from edge of cervix. Blue outline is more regular shaped lesion similar in appearance. Colposcopy performed with aceto-white lesions at 10-11, 8, 5:00. Biopsies performed. ECC performed.    Cervical Biopsy, 2:00: ENDOCERVICAL ADENOCARCINOMA-IN-SITU.  Cervical Biopsy, 5:00: LOW-GRADE SQUAMOUS INTRAEPITHELIAL LESION (CIN 1).  Cervical Biopsy, 8:00: ENDOCERVICAL ADENOCARCINOMA-IN-SITU.  Cervical Biopsy,10:00: ENDOCERVICAL ADENOCARCINOMA-IN-SITU.  Endocervical Curettings: ENDOCERVICAL ADENOCARCINOMA-IN-SITU.    PAP 2022 negative, HPV HR+   PAP 3/19 normal    IUD for contraception  Proposed surgery: Cold Knife Conization  Past Medical History:  Diagnosis Date   Adenocarcinoma in situ of endocervix    Asymptomatic varicose veins of left lower extremity    Axillary hidradenitis suppurativa    Bilateral carpal tunnel syndrome    a.) (+) associated paraesthesias to BILATERAL hands   Hypertension    blood pressure up and down since pregnancy   Joint pain    Migraines    Raynaud's disease without gangrene    Past Surgical History:  Procedure Laterality Date   BREAST BIOPSY Left 12/11/2022   US  LT BREAST BX W LOC DEV 1ST LESION IMG BX SPEC US  GUIDE 12/11/2022 [facility]   CARPAL TUNNEL RELEASE Right  06/30/2018   Procedure: CARPAL TUNNEL RELEASE ENDOSCOPIC;  Surgeon: Elner Hahn, MD;  Location: [facility];  Service: Orthopedics;  Laterality: Right;   CARPAL TUNNEL RELEASE Left 06/18/2021   Procedure: CARPAL TUNNEL RELEASE ENDOSCOPIC;  Surgeon: Elner Hahn, MD;  Location: [facility];  Service: Orthopedics;  Laterality: Left;   OB History  Gravida Para Term Preterm AB Living  1 1 1   1   SAB IAB Ectopic Multiple Live Births     0 1    # Outcome Date GA Lbr Len/2nd Weight Sex Type Anes PTL Lv  1 Term 01/26/18 [redacted]w[redacted]d / 02:42 3000 g M Vag-Spont EPI  LIV  Patient denies any other pertinent gynecologic issues.   No current facility-administered medications on file prior to encounter.   Current Outpatient Medications on File Prior to Encounter  Medication Sig Dispense Refill   BIOTIN PO Take 1 tablet by mouth daily.     Digestive Enzymes (DIGESTIVE ENZYME PO) Take 1 tablet by mouth daily.     levonorgestrel (LILETTA) 20.1 MCG/DAY IUD IUD 1 each by Intrauterine route once.     Magnesium Chloride (MAGNESIUM DR PO) Take 1 tablet by mouth daily.     OIL OF OREGANO PO Take 1 capsule by mouth daily.     zonisamide (ZONEGRAN) 100 MG capsule Take 100 mg by mouth daily.     Allergies  Allergen Reactions   Penicillins Other (See Comments)    Doesn't know symptoms last had it as a child  As a child    Social History:   reports that she has quit smoking. Her smoking use included cigarettes.  She has never used smokeless tobacco. She reports that she does not currently use alcohol. She reports that she does not use drugs.  Family History  Problem Relation Age of Onset   Breast cancer Neg Hx     Review of Systems: Noncontributory  PHYSICAL EXAM: Last menstrual period 09/22/2023. CONSTITUTIONAL: Well-developed, well-nourished female in no acute distress.  HENT:  Normocephalic, atraumatic, External right and left ear normal. Oropharynx is clear and moist EYES: Conjunctivae and EOM  are normal. Pupils are equal, round, and reactive to light. No scleral icterus.  NECK: Normal range of motion, supple, no masses SKIN: Skin is warm and dry. No rash noted. Not diaphoretic. No erythema. No pallor. NEUROLGIC: Alert and oriented to person, place, and time. Normal reflexes, muscle tone coordination. No cranial nerve deficit noted. PSYCHIATRIC: Normal mood and affect. Normal behavior. Normal judgment and thought content. CARDIOVASCULAR: Normal heart rate noted, regular rhythm RESPIRATORY: Effort and breath sounds normal, no problems with respiration noted ABDOMEN: Soft, nontender, nondistended. PELVIC: Deferred MUSCULOSKELETAL: Normal range of motion. No edema and no tenderness. 2+ distal pulses.    Assessment: Adenocarcinoma in situ of cervix  Plan: Patient will undergo surgical management with the above-noted surgery.   The risks of surgery were discussed in detail with the patient including but not limited to: bleeding which may require transfusion or reoperation; infection which may require antibiotics; injury to surrounding organs which may involve bowel, bladder, ureters ; need for additional procedures including laparoscopy or laparotomy; thromboembolic phenomenon, surgical site problems and other postoperative/anesthesia complications. Likelihood of success in alleviating the patient's condition was discussed. Routine postoperative instructions will be reviewed with the patient and her family in detail after surgery.  The patient concurred with the proposed plan, giving informed written consent for the surgery.  Patient has been NPO since last night she will remain NPO for procedure.  Anesthesia and OR aware.  Preoperative prophylactic antibiotics, as indicated, and SCDs ordered on call to the OR.  To OR when ready.  Jeani Mill, MD 12/08/2023 1:26 PM

## 2023-12-09 ENCOUNTER — Other Ambulatory Visit: Payer: Self-pay

## 2023-12-09 ENCOUNTER — Encounter
Admission: RE | Disposition: A | Discharge status: Home / Self Care | Payer: Self-pay | Source: Home / Self Care | Attending: Obstetrics and Gynecology

## 2023-12-09 ENCOUNTER — Encounter: Payer: Self-pay | Admitting: Obstetrics and Gynecology

## 2023-12-09 ENCOUNTER — Ambulatory Visit: Payer: Self-pay | Admitting: Urgent Care

## 2023-12-09 ENCOUNTER — Ambulatory Visit
Admission: RE | Admit: 2023-12-09 | Discharge: 2023-12-09 | Disposition: A | Discharge status: Home / Self Care | Attending: Obstetrics and Gynecology | Admitting: Obstetrics and Gynecology

## 2023-12-09 DIAGNOSIS — I10 Essential (primary) hypertension: Secondary | ICD-10-CM | POA: Diagnosis not present

## 2023-12-09 DIAGNOSIS — C530 Malignant neoplasm of endocervix: Secondary | ICD-10-CM | POA: Insufficient documentation

## 2023-12-09 DIAGNOSIS — K219 Gastro-esophageal reflux disease without esophagitis: Secondary | ICD-10-CM | POA: Insufficient documentation

## 2023-12-09 DIAGNOSIS — Z30432 Encounter for removal of intrauterine contraceptive device: Secondary | ICD-10-CM | POA: Diagnosis not present

## 2023-12-09 DIAGNOSIS — D069 Carcinoma in situ of cervix, unspecified: Secondary | ICD-10-CM | POA: Diagnosis present

## 2023-12-09 DIAGNOSIS — Z87891 Personal history of nicotine dependence: Secondary | ICD-10-CM | POA: Insufficient documentation

## 2023-12-09 DIAGNOSIS — D060 Carcinoma in situ of endocervix: Secondary | ICD-10-CM | POA: Diagnosis not present

## 2023-12-09 DIAGNOSIS — Z01812 Encounter for preprocedural laboratory examination: Secondary | ICD-10-CM

## 2023-12-09 LAB — CBC
HCT: 41.9 % (ref 36.0–46.0)
Hemoglobin: 14.3 g/dL (ref 12.0–15.0)
MCH: 29.3 pg (ref 26.0–34.0)
MCHC: 34.1 g/dL (ref 30.0–36.0)
MCV: 85.9 fL (ref 80.0–100.0)
Platelets: 242 10*3/uL (ref 150–400)
RBC: 4.88 MIL/uL (ref 3.87–5.11)
RDW: 13 % (ref 11.5–15.5)
WBC: 7.2 10*3/uL (ref 4.0–10.5)
nRBC: 0 % (ref 0.0–0.2)

## 2023-12-09 LAB — POCT PREGNANCY, URINE: Preg Test, Ur: NEGATIVE

## 2023-12-09 SURGERY — CONE BIOPSY, CERVIX
Anesthesia: General | Site: Cervix

## 2023-12-09 MED ORDER — ACETIC ACID 3 % SOLN
Status: AC
  Filled 2023-12-09: qty 500

## 2023-12-09 MED ORDER — PROPOFOL 10 MG/ML IV BOLUS
INTRAVENOUS | Status: DC | PRN
  Administered 2023-12-09: 200 mg via INTRAVENOUS

## 2023-12-09 MED ORDER — SILVER NITRATE-POT NITRATE 75-25 % EX MISC
CUTANEOUS | Status: AC
  Filled 2023-12-09: qty 10

## 2023-12-09 MED ORDER — LACTATED RINGERS IV SOLN
INTRAVENOUS | Status: AC
Start: 2023-12-09 — End: 2023-12-09

## 2023-12-09 MED ORDER — OXYCODONE HCL 5 MG PO TABS
ORAL_TABLET | ORAL | Status: AC
  Filled 2023-12-09: qty 1

## 2023-12-09 MED ORDER — GLYCOPYRROLATE 0.2 MG/ML IJ SOLN
INTRAMUSCULAR | Status: DC | PRN
  Administered 2023-12-09: .2 mg via INTRAVENOUS

## 2023-12-09 MED ORDER — MONSELS FERRIC SUBSULFATE EX SOLN
CUTANEOUS | Status: DC | PRN
  Administered 2023-12-09: 1 via TOPICAL

## 2023-12-09 MED ORDER — FENTANYL CITRATE (PF) 100 MCG/2ML IJ SOLN: 25.0000 ug | INTRAMUSCULAR | Status: DC | PRN

## 2023-12-09 MED ORDER — KETOROLAC TROMETHAMINE 30 MG/ML IJ SOLN
INTRAMUSCULAR | Status: DC | PRN
  Administered 2023-12-09: 30 mg via INTRAVENOUS

## 2023-12-09 MED ORDER — DEXAMETHASONE SODIUM PHOSPHATE 10 MG/ML IJ SOLN
INTRAMUSCULAR | Status: AC
  Filled 2023-12-09: qty 1

## 2023-12-09 MED ORDER — DEXMEDETOMIDINE HCL IN NACL 80 MCG/20ML IV SOLN
INTRAVENOUS | Status: DC | PRN
  Administered 2023-12-09: 8 ug via INTRAVENOUS

## 2023-12-09 MED ORDER — ONDANSETRON HCL 4 MG/2ML IJ SOLN
INTRAMUSCULAR | Status: DC | PRN
  Administered 2023-12-09: 4 mg via INTRAVENOUS

## 2023-12-09 MED ORDER — HYDROCODONE-ACETAMINOPHEN 5-325 MG PO TABS: 1.0000 | ORAL_TABLET | Freq: Four times a day (QID) | ORAL | 0 refills | Status: DC | PRN

## 2023-12-09 MED ORDER — LIDOCAINE-EPINEPHRINE 1 %-1:100000 IJ SOLN
INTRAMUSCULAR | Status: AC
  Filled 2023-12-09: qty 1

## 2023-12-09 MED ORDER — DROPERIDOL 2.5 MG/ML IJ SOLN: 0.6250 mg | Freq: Once | INTRAMUSCULAR | Status: DC | PRN

## 2023-12-09 MED ORDER — MIDAZOLAM HCL 2 MG/2ML IJ SOLN
INTRAMUSCULAR | Status: DC | PRN
  Administered 2023-12-09: 2 mg via INTRAVENOUS

## 2023-12-09 MED ORDER — OXYCODONE HCL 5 MG PO TABS
5.0000 mg | ORAL_TABLET | Freq: Once | ORAL | Status: AC
  Administered 2023-12-09: 5 mg via ORAL

## 2023-12-09 MED ORDER — HEMOSTATIC AGENTS (NO CHARGE) OPTIME
TOPICAL | Status: DC | PRN
  Administered 2023-12-09: 1 via TOPICAL

## 2023-12-09 MED ORDER — PROPOFOL 10 MG/ML IV BOLUS
INTRAVENOUS | Status: AC
  Filled 2023-12-09: qty 20

## 2023-12-09 MED ORDER — CHLORHEXIDINE GLUCONATE 0.12 % MT SOLN
OROMUCOSAL | Status: AC
  Filled 2023-12-09: qty 15

## 2023-12-09 MED ORDER — FENTANYL CITRATE (PF) 100 MCG/2ML IJ SOLN
INTRAMUSCULAR | Status: DC | PRN
  Administered 2023-12-09 (×2): 50 ug via INTRAVENOUS

## 2023-12-09 MED ORDER — LIDOCAINE HCL (CARDIAC) PF 100 MG/5ML IV SOSY
PREFILLED_SYRINGE | INTRAVENOUS | Status: DC | PRN
Start: 2023-12-09 — End: 2023-12-09
  Administered 2023-12-09: 100 mg via INTRAVENOUS

## 2023-12-09 MED ORDER — MONSELS FERRIC SUBSULFATE EX SOLN
CUTANEOUS | Status: AC
  Filled 2023-12-09: qty 8

## 2023-12-09 MED ORDER — ACETAMINOPHEN 10 MG/ML IV SOLN
INTRAVENOUS | Status: AC
  Filled 2023-12-09: qty 100

## 2023-12-09 MED ORDER — ORAL CARE MOUTH RINSE
15.0000 mL | Freq: Once | OROMUCOSAL | Status: AC
Start: 2023-12-09 — End: 2023-12-09

## 2023-12-09 MED ORDER — MIDAZOLAM HCL 2 MG/2ML IJ SOLN
INTRAMUSCULAR | Status: AC
  Filled 2023-12-09: qty 2

## 2023-12-09 MED ORDER — ONDANSETRON HCL 4 MG/2ML IJ SOLN
INTRAMUSCULAR | Status: AC
  Filled 2023-12-09: qty 2

## 2023-12-09 MED ORDER — CHLORHEXIDINE GLUCONATE 0.12 % MT SOLN
15.0000 mL | Freq: Once | OROMUCOSAL | Status: AC
Start: 2023-12-09 — End: 2023-12-09
  Administered 2023-12-09: 15 mL via OROMUCOSAL

## 2023-12-09 MED ORDER — ACETAMINOPHEN 10 MG/ML IV SOLN
INTRAVENOUS | Status: DC | PRN
  Administered 2023-12-09: 1000 mg via INTRAVENOUS

## 2023-12-09 MED ORDER — FENTANYL CITRATE (PF) 100 MCG/2ML IJ SOLN
INTRAMUSCULAR | Status: AC
Start: 2023-12-09 — End: 2023-12-09
  Filled 2023-12-09: qty 2

## 2023-12-09 MED ORDER — LACTATED RINGERS IV SOLN: INTRAVENOUS | Status: DC

## 2023-12-09 MED ORDER — IODINE STRONG (LUGOLS) 5 % PO SOLN
ORAL | Status: DC | PRN
  Administered 2023-12-09: .2 mL

## 2023-12-09 MED ORDER — DEXAMETHASONE SODIUM PHOSPHATE 10 MG/ML IJ SOLN
INTRAMUSCULAR | Status: DC | PRN
  Administered 2023-12-09: 10 mg via INTRAVENOUS

## 2023-12-09 MED ORDER — IODINE STRONG (LUGOLS) 5 % PO SOLN
ORAL | Status: AC
Start: 2023-12-09 — End: 2023-12-09
  Filled 2023-12-09: qty 1

## 2023-12-09 MED ORDER — LIDOCAINE HCL (PF) 2 % IJ SOLN
INTRAMUSCULAR | Status: AC
  Filled 2023-12-09: qty 5

## 2023-12-09 MED ORDER — 0.9 % SODIUM CHLORIDE (POUR BTL) OPTIME
TOPICAL | Status: DC | PRN
  Administered 2023-12-09: 500 mL

## 2023-12-09 MED ORDER — LIDOCAINE-EPINEPHRINE 1 %-1:100000 IJ SOLN
INTRAMUSCULAR | Status: DC | PRN
Start: 2023-12-09 — End: 2023-12-09
  Administered 2023-12-09: 4 mL

## 2023-12-09 SURGICAL SUPPLY — 39 items
APPLICATOR COTTON TIP 6 STRL (MISCELLANEOUS) ×3 IMPLANT
APPLICATOR COTTON TIP 6IN STRL (MISCELLANEOUS) ×1 IMPLANT
BAG URINE DRAIN 2000ML AR STRL (UROLOGICAL SUPPLIES) IMPLANT
BASIN GRAD PLASTIC 32OZ STRL (MISCELLANEOUS) IMPLANT
BLADE SURG 11 STRL SS (BLADE) IMPLANT
CATH URTH 16FR FL 2W BLN LF (CATHETERS) IMPLANT
CLEANER CAUTERY TIP PAD (MISCELLANEOUS) ×1 IMPLANT
CNTNR URN SCR LID CUP LEK RST (MISCELLANEOUS) ×1 IMPLANT
CUP MEDICINE 2OZ PLAST GRAD ST (MISCELLANEOUS) IMPLANT
DRSG TELFA 3X8 NADH STRL (GAUZE/BANDAGES/DRESSINGS) IMPLANT
ELECTRODE LEEP BALL 5MM 12CM (MISCELLANEOUS) ×1 IMPLANT
ELECTRODE LEP LOOP 20X10 R2010 (MISCELLANEOUS) ×1 IMPLANT
ELECTRODE LOOP 1.0X1.0CM R1010 (MISCELLANEOUS) ×1 IMPLANT
ELECTRODE REM PT RTRN 9FT ADLT (ELECTROSURGICAL) ×1 IMPLANT
GLOVE BIO SURGEON STRL SZ7 (GLOVE) ×1 IMPLANT
GLOVE BIOGEL PI IND STRL 7.5 (GLOVE) ×1 IMPLANT
GOWN STRL REUS W/ TWL LRG LVL3 (GOWN DISPOSABLE) ×2 IMPLANT
HANDLE YANKAUER SUCT BULB TIP (MISCELLANEOUS) IMPLANT
HEMOSTAT SURGICEL 2X3 (HEMOSTASIS) IMPLANT
KIT TURNOVER CYSTO (KITS) ×1 IMPLANT
MANIFOLD NEPTUNE II (INSTRUMENTS) ×1 IMPLANT
NDL SPNL 22GX3.5 QUINCKE BK (NEEDLE) ×1 IMPLANT
NEEDLE SPNL 22GX3.5 QUINCKE BK (NEEDLE) ×1 IMPLANT
NS IRRIG 500ML POUR BTL (IV SOLUTION) ×1 IMPLANT
PACK DNC HYST (MISCELLANEOUS) ×1 IMPLANT
PAD OB MATERNITY 11 LF (PERSONAL CARE ITEMS) ×1 IMPLANT
PAD PREP OB/GYN DISP 24X41 (PERSONAL CARE ITEMS) ×1 IMPLANT
SCRUB CHG 4% DYNA-HEX 4OZ (MISCELLANEOUS) ×1 IMPLANT
SET CYSTO W/LG BORE CLAMP LF (SET/KITS/TRAYS/PACK) IMPLANT
STRAW SMOKE EVAC LEEP 6150 NON (MISCELLANEOUS) ×1 IMPLANT
SUT VIC AB 0 CT1 36 (SUTURE) IMPLANT
SUT VIC AB 0 CT2 27 (SUTURE) IMPLANT
SUT VIC AB 2-0 SH 27XBRD (SUTURE) IMPLANT
SYR 10ML LL (SYRINGE) IMPLANT
SYR CONTROL 10ML LL (SYRINGE) ×1 IMPLANT
TOWEL OR 17X26 4PK STRL BLUE (TOWEL DISPOSABLE) ×1 IMPLANT
TRAP FLUID SMOKE EVACUATOR (MISCELLANEOUS) ×1 IMPLANT
TUBING CONNECTING 10 (TUBING) ×1 IMPLANT
WATER STERILE IRR 500ML POUR (IV SOLUTION) ×1 IMPLANT

## 2023-12-09 NOTE — Anesthesia Preprocedure Evaluation (Signed)
 Anesthesia Evaluation  Patient identified by MRN, date of birth, ID band Patient awake    Reviewed: Allergy & Precautions, H&P , NPO status , Patient's Chart, lab work & pertinent test results, reviewed documented beta blocker date and time   History of Anesthesia Complications Negative for: history of anesthetic complications  Airway Mallampati: III  TM Distance: >3 FB Neck ROM: full    Dental  (+) Dental Advidsory Given, Teeth Intact, Missing   Pulmonary neg shortness of breath, sleep apnea (mild) , neg COPD, neg recent URI, former smoker   Pulmonary exam normal breath sounds clear to auscultation       Cardiovascular Exercise Tolerance: Good hypertension, (-) angina (-) Past MI and (-) Cardiac Stents negative cardio ROS Normal cardiovascular exam(-) dysrhythmias (-) Valvular Problems/Murmurs Rhythm:regular Rate:Normal     Neuro/Psych negative neurological ROS  negative psych ROS   GI/Hepatic Neg liver ROS,GERD  ,,  Endo/Other  negative endocrine ROS    Renal/GU negative Renal ROS  negative genitourinary   Musculoskeletal   Abdominal   Peds  Hematology negative hematology ROS (+)   Anesthesia Other Findings Past Medical History: No date: Asymptomatic varicose veins of left lower extremity No date: Axillary hidradenitis suppurativa No date: Bilateral carpal tunnel syndrome     Comment:  a.) (+) associated paraesthesias to BILATERAL hands No date: Hypertension     Comment:  blood pressure up and down since pregnancy No date: Joint pain No date: Migraines No date: Raynaud's disease without gangrene   Reproductive/Obstetrics negative OB ROS                             Anesthesia Physical Anesthesia Plan  ASA: 2  Anesthesia Plan: General   Post-op Pain Management:    Induction: Intravenous  PONV Risk Score and Plan: 3 and Ondansetron , Dexamethasone , Midazolam , Treatment may  vary due to age or medical condition and Droperidol  Airway Management Planned: LMA  Additional Equipment:   Intra-op Plan:   Post-operative Plan: Extubation in OR  Informed Consent: I have reviewed the patients History and Physical, chart, labs and discussed the procedure including the risks, benefits and alternatives for the proposed anesthesia with the patient or authorized representative who has indicated his/her understanding and acceptance.     Dental Advisory Given  Plan Discussed with: Anesthesiologist, CRNA and Surgeon  Anesthesia Plan Comments:         Anesthesia Quick Evaluation

## 2023-12-09 NOTE — Op Note (Signed)
  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  EBL: 5 mL   IVF: 500 mL   Urine output: 150 mL clear urine  Specimens:  1) Cervical conization specimen 2) Endocervical curettage  Drains: None  Complications: None   Disposition: PACU   Condition: Stable   Findings: Lugols negative staining in expected area of cervix  Procedure Summary:  Patient was taken to the operating room where she was administered general anesthesia.  She was positioned in the dorsal lithotomy position utilizing [employer]. She was prepped and draped in the usual sterile fashion.  Prior to proceeding with procedure a time out was performed.  Attention was turned to the patient's pelvis.  A red rubber catheter was used to empty the patient's bladder.  An operative speculum was placed to allow visualization of the cervix.  The intrauterine device strings were visualized and grasped. The intrauterine device was removed in its entirety.  Lugol's was applied to the cervix using a soft cotton swab noting the above mentioned staining pattern.  Stay sutures of 0 Vicryl were placed in the vaginal fornix at 9 and 3 O'Clock,  The cervix was injected with 8 mL of a 1% lidocaine  with epinephrine .  A uterine sound was placed to delineate the cervical canal.The anterior and posterior lips of the cervix was grasped with a single tooth tenacula, and a circumferential incision was scored with a #11 blade on the ectocervix in a circumferential fashion.  A suture was placed within the stroma of  the specimen at 12 o'clock and tied.  Another suture of 2-0 vicryl was placed within the stroma of the specimen at 6 o'clock (not tied).  The edges of the specimen were grasped with an alice clamp and the incision was carried cephalad circumferentially using the cut function of the Bovi.   After excision of the cone specimen was removed and the 6 o'clock suture was removed so that only the 12 o'clock margin was tagged.  Cervical biopsy forceps were utilized to obtain endocervical sampling and an ECC was also performed to gain further sampling.   The cone bed was cauterized using a roller ball.  Interrupted 0-Vicryl stitches were placed in a figure-of-eight pattern along the posterior cervix, extending from the ectocervix to the endocervix.  The anterior cervix was verified to be hemostatic with cautery as needed.  Monsel solution was applied liberally followed by one sheet of Surgicel.  The stay sutures were tied together in the midline.  Additional 2-0 Vicryl stitches were placed anteriorly and posteriorly to the stay suture tie.   The patient tolerated the procedure well.  Sponge, lap, needle, and instrument counts were correct x 2.  VTE prophylaxis: SCDs. Antibiotic prophylaxis: not indicated. She was awakened in the operating room and was taken to the PACU in stable condition. The assistant surgeon was an MD due to lack of availability of another Sales promotion account executive.   Jeani Mill, MD 12/09/2023 9:02 AM

## 2023-12-09 NOTE — Anesthesia Procedure Notes (Signed)
 Procedure Name: LMA Insertion Date/Time: 12/09/2023 7:34 AM  Performed by: Angelia Kelp, CRNAPre-anesthesia Checklist: Patient identified, Emergency Drugs available, Suction available, Patient being monitored and Timeout performed Patient Re-evaluated:Patient Re-evaluated prior to induction Oxygen Delivery Method: Circle system utilized Preoxygenation: Pre-oxygenation with 100% oxygen Induction Type: IV induction LMA: LMA inserted LMA Size: 4.0 Tube type: Oral Number of attempts: 1 Placement Confirmation: positive ETCO2 and breath sounds checked- equal and bilateral Tube secured with: Tape Dental Injury: Teeth and Oropharynx as per pre-operative assessment

## 2023-12-09 NOTE — Interval H&P Note (Signed)
 History and Physical Interval Note:  12/09/2023 7:14 AM  Theresa Davidson  has presented today for surgery, with the diagnosis of adenocarcinoma in situ of cervix.  The various methods of treatment have been discussed with the patient and family. After consideration of risks, benefits and other options for treatment, the patient has consented to  Procedure(s): CONE BIOPSY, CERVIX (N/A) as a surgical intervention.  The patient's history has been reviewed, patient examined, no change in status, stable for surgery.  I have reviewed the patient's chart and labs.  Questions were answered to the patient's satisfaction.  Consents reviewed and signed.   Jeani Mill, MD, [facility] 12/09/2023 7:14 AM

## 2023-12-09 NOTE — Transfer of Care (Signed)
 Immediate Anesthesia Transfer of Care Note  Patient: Theresa Davidson  Procedure(s) Performed: CONE BIOPSY, CERVIX; ENDOCERVICAL CURETTINGS (Cervix)  Patient Location: PACU  Anesthesia Type:General  Level of Consciousness: sedated and drowsy  Airway & Oxygen Therapy: Patient Spontanous Breathing and Patient connected to face mask oxygen  Post-op Assessment: Report given to RN and Post -op Vital signs reviewed and stable  Post vital signs: Reviewed and stable  Last Vitals:  Vitals Value Taken Time  BP 128/74 12/09/23 08:53  Temp 36.4 C 12/09/23 08:53  Pulse 76 12/09/23 08:54  Resp 10 12/09/23 08:54  SpO2 100 % 12/09/23 08:54  Vitals shown include unfiled device data.  Last Pain:  Vitals:   12/09/23 0624  TempSrc: Temporal  PainSc: 0-No pain         Complications: No notable events documented.

## 2023-12-10 ENCOUNTER — Encounter: Payer: Self-pay | Admitting: Obstetrics and Gynecology

## 2023-12-10 NOTE — Anesthesia Postprocedure Evaluation (Signed)
 Anesthesia Post Note  Patient: Theresa Davidson  Procedure(s) Performed: CONE BIOPSY, CERVIX; ENDOCERVICAL CURETTINGS (Cervix)  Patient location during evaluation: PACU Anesthesia Type: General Level of consciousness: awake and alert Pain management: pain level controlled Vital Signs Assessment: post-procedure vital signs reviewed and stable Respiratory status: spontaneous breathing, nonlabored ventilation, respiratory function stable and patient connected to nasal cannula oxygen Cardiovascular status: blood pressure returned to baseline and stable Postop Assessment: no apparent nausea or vomiting Anesthetic complications: no   No notable events documented.   Last Vitals:  Vitals:   12/09/23 0945 12/09/23 0953  BP:  (!) 147/82  Pulse:  (!) 59  Resp:  18  Temp: (!) 36.4 C (!) 36.2 C  SpO2:  100%    Last Pain:  Vitals:   12/09/23 0953  TempSrc: Temporal  PainSc: 3                  Vanice Genre

## 2023-12-11 LAB — SURGICAL PATHOLOGY

## 2023-12-14 ENCOUNTER — Telehealth: Payer: Self-pay | Admitting: Obstetrics and Gynecology

## 2023-12-14 ENCOUNTER — Telehealth: Payer: Self-pay

## 2023-12-14 NOTE — Telephone Encounter (Signed)
 Discussed diagnosis of invasive adenocarcinoma of the cervix.  Discussed that we will get gyn onc to help us  determine next steps.  All questions answered at this time. She is having some body aches, temp not > 100F.  No significant bleeding.

## 2023-12-14 NOTE — Telephone Encounter (Signed)
 Pathology results from cone biopsy sent to Dr. Mancil for review.

## 2023-12-15 ENCOUNTER — Telehealth: Payer: Self-pay

## 2023-12-15 NOTE — Telephone Encounter (Signed)
 Called and left voicemail with Ms. Kotecki to return call to arrange post op appointment with Dr. Mancil regarding pathology and to discuss additional surgery.

## 2023-12-15 NOTE — Telephone Encounter (Signed)
 Dr. Mancil has reviewed pathology and recommends the following:  She should be adequately treated by a simple hysterectomy in about six weeks. Does not seem to have anything that warrants a radical hysterectomy and nodes. Notification sent to Dr. Leonce.

## 2023-12-30 ENCOUNTER — Inpatient Hospital Stay: Attending: Obstetrics and Gynecology | Admitting: Obstetrics and Gynecology

## 2023-12-30 VITALS — BP 131/75 | HR 79 | Temp 98.7°F | Resp 20 | Wt 218.8 lb

## 2023-12-30 DIAGNOSIS — C539 Malignant neoplasm of cervix uteri, unspecified: Secondary | ICD-10-CM | POA: Diagnosis present

## 2023-12-30 DIAGNOSIS — Z7189 Other specified counseling: Secondary | ICD-10-CM | POA: Diagnosis not present

## 2023-12-30 NOTE — Progress Notes (Signed)
 Gynecologic Oncology Interval Visit  RMD: Dr. Garnette Mace  Chief complaints: counseling for   History of Present Illness: Theresa Davidson is a 43 y.o. G1P1 female who presents for management of cervical adenocarcinoma in situ.   On 12/09/2023 Cold Knife Conization of Cervix and removal of Intrauterine device.   FINAL DIAGNOSIS       1. Cervix, cone, Cone biopsy stitch at 12 o'clock :      - ENDOCERVICAL ADENOCARCINOMA INVOLVING 9:00-12:00 AND 12:00-3:00 QUADRANTS.      - DEPTH OF INVASION: 3.0 MM.      - ENDOCERVICAL ADENOCARCINOMA IN SITU INVOLVING 6:00-9:00, 9:00-12:00, AND      12:00-3:00 QUADRANTS.      - HIGH-GRADE SQUAMOUS INTRAEPITHELIAL LESION (HSIL/CIN-3) INVOLVING 6:00-9:00      QUADRANT.      - ALL MARGINS ARE NEGATIVE FOR DYSPLASIA AND MALIGNANCY.      - SEE NOTE.       2. Endocervix, curettage,  :      - ENDOCERVIX WITH THERMAL ARTIFACT.      - DYSPLASIA AND MALIGNANCY ARE NOT IDENTIFIED.       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       Based on pathology she has IA1 Measured stromal invasion <=3 mm in depth.   She has done well postop and has not complaints. She does not desire future fertility.    Gynecologic Oncology History  Theresa Davidson is a pleasant G1P1 female who presents for management of cervical adenocarcinoma in situ.   09/16/23 seen for annual Gyn exam.  No bleeding or discharge.     Pap History:  2016- NILM, hpv not performed 09/08/2017- NILM, hpv not performed 12/10/2020- NILM, HPV Positive (12 mo f/u rec) 09/16/23- Endocervical adenocarcinoma in situ, HPV Positive. Genotype not performed   10/19/23- Cervix was reddened, lobular, irregular surface exophytic lesion w/o regular borders emanating from edge of cervix. Blue outline is more regular shaped lesion similar in appearance. Colposcopy  performed with aceto-white lesions at 10-11, 8, 5:00. Biopsies performed. ECC performed.    Cervical Biopsy, 2:00: ENDOCERVICAL ADENOCARCINOMA-IN-SITU.  Cervical Biopsy, 5:00: LOW-GRADE SQUAMOUS INTRAEPITHELIAL LESION (CIN 1).  Cervical Biopsy, 8:00: ENDOCERVICAL ADENOCARCINOMA-IN-SITU.  Cervical Biopsy,10:00: ENDOCERVICAL ADENOCARCINOMA-IN-SITU.  Endocervical Curettings: ENDOCERVICAL ADENOCARCINOMA-IN-SITU.    PAP 2022 negative, HPV HR+   PAP 3/19 normal    IUD for contraception  Cold Knife Conization was recommended.   Past Medical History:  Diagnosis Date   Adenocarcinoma in situ of endocervix    Asymptomatic varicose veins of left lower extremity    Axillary hidradenitis suppurativa    Bilateral carpal tunnel syndrome    a.) (+) associated paraesthesias to BILATERAL hands   Hypertension    blood pressure up and down since pregnancy   Joint pain    Migraines    Raynaud's disease without gangrene    Past Surgical History:  Procedure Laterality Date   BREAST BIOPSY Left 12/11/2022   US  LT BREAST BX W LOC DEV 1ST LESION IMG BX SPEC US  GUIDE 12/11/2022 [facility]   CARPAL TUNNEL RELEASE Right 06/30/2018   Procedure: CARPAL TUNNEL RELEASE ENDOSCOPIC;  Surgeon: Edie Norleen PARAS, MD;  Location: [facility];  Service: Orthopedics;  Laterality: Right;   CARPAL TUNNEL RELEASE Left 06/18/2021   Procedure: CARPAL TUNNEL RELEASE ENDOSCOPIC;  Surgeon: Edie Norleen PARAS, MD;  Location: [facility];  Service: Orthopedics;  Laterality: Left;   CERVICAL CONIZATION W/BX N/A 12/09/2023   Procedure: CONE BIOPSY, CERVIX; ENDOCERVICAL CURETTINGS;  Surgeon: Leonce Garnette BIRCH, MD;  Location: [facility];  Service: Gynecology;  Laterality: N/A;   OB History  Gravida Para Term Preterm AB Living  1 1 1   1   SAB IAB Ectopic Multiple Live Births     0 1    # Outcome Date GA Lbr Len/2nd Weight Sex Type Anes PTL Lv  1 Term 01/26/18 [redacted]w[redacted]d / 02:42 6 lb 9.8 oz (3 kg) M Vag-Spont EPI  LIV  Patient  denies any other pertinent gynecologic issues.   Current Outpatient Medications on File Prior to Visit  Medication Sig Dispense Refill   BIOTIN PO Take 1 tablet by mouth daily.     Digestive Enzymes (DIGESTIVE ENZYME PO) Take 1 tablet by mouth daily.     HYDROcodone -acetaminophen  (NORCO/VICODIN) 5-325 MG tablet Take 1 tablet by mouth every 6 (six) hours as needed. 10 tablet 0   Magnesium Chloride (MAGNESIUM DR PO) Take 1 tablet by mouth daily.     meloxicam (MOBIC) 15 MG tablet Take 15 mg by mouth as needed for pain.     OIL OF OREGANO PO Take 1 capsule by mouth daily.     zonisamide (ZONEGRAN) 100 MG capsule Take 100 mg by mouth daily.     No current facility-administered medications on file prior to visit.   Allergies  Allergen Reactions   Penicillins Other (See Comments)    Doesn't know symptoms last had it as a child  As a child    Social History:   reports that she has quit smoking. Her smoking use included cigarettes. She has never used smokeless tobacco. She reports that she does not currently use alcohol. She reports that she does not use drugs.  Family History  Problem Relation Age of Onset   Breast cancer Neg Hx     Review of Systems:  General: no complaints  HEENT: no complaints  Lungs: no complaints  Cardiac: no complaints  GI: no complaints  GU: no complaints  Musculoskeletal: no complaints  Extremities: no complaints  Skin: no complaints  Neuro: no complaints  Endocrine: no complaints  Psych: no complaints       PHYSICAL EXAM: Blood pressure 131/75, pulse 79, temperature 98.7 F (37.1 C), resp. rate 20, weight 218 lb 12.8 oz (99.2 kg), last menstrual period 09/22/2023, SpO2 100%.   ECOG - 0   GENERAL: Patient is a well appearing female in no acute distress HEENT:  atraumatic normocephalic  LUNGS: Normal respiratory effort NEURO:  Nonfocal. Well oriented.  Appropriate affect.  Pelvic: deferred   Labs Lab Results  Component Value Date   WBC  7.2 12/09/2023   HGB 14.3 12/09/2023   HCT 41.9 12/09/2023   MCV 85.9 12/09/2023   PLT 242 12/09/2023   09/16/2023 Component Ref Range & Units 3 mo ago  Glucose 70 - 110 mg/dL 90  Sodium 863 - 854 mmol/L 138  Potassium 3.6 - 5.1 mmol/L 3.9  Chloride 97 - 109 mmol/L 103  Carbon Dioxide (CO2) 22.0 - 32.0 mmol/L 27.4  Urea Nitrogen (BUN) 7 - 25 mg/dL 18  Creatinine 0.6 - 1.1 mg/dL 0.8  Glomerular Filtration Rate (eGFR) >60 mL/min/1.73sq m 94   Calcium 8.7 - 10.3 mg/dL 9.4  AST 8 - 39 U/L 16  ALT 5 - 38 U/L 23  Alk Phos (alkaline Phosphatase) 34 - 104 U/L 78  Albumin 3.5 - 4.8 g/dL 4.6  Bilirubin, Total 0.3 - 1.2 mg/dL 0.6  Protein, Total 6.1 - 7.9 g/dL 7.2  A/G Ratio 1.0 - 5.0 gm/dL 1.8      Assessment: Stage 1A1 Adenocarcinoma of the cervix, no LVSI s/p CKC and IUD removal. She does not desire future fertility   Plan: Dr. Mancil is already reviewed her pathology.  Given that she has stage I A1 disease with stromal invasion 3 mm and horizontal spread of less than 10 mm she is a candidate for simple hysterectomy.  There is no evidence of lymph vascular positivity.  She does not desire future fertility.  We reviewed her pathology results in detail and provided recommendations.  In addition to total laparoscopic hysterectomy I also recommended opportunistic bilateral salpingectomy.  I recommended retention of her ovaries given her young age and the very small lesion that she had with early stage disease.  She had an opportunity to ask questions and answers were provided accordingly.  She will discuss these recommendations further with Dr. Leonce.    I have contacted Dr. Leonce and his office will arrange for her visit with him for preoperative evaluation and consent.  I have also reviewed the pathology and plan with Dr. Mancil.  A message been sent to Rayfield Jasmine, Nurse Navigator, to arrange an OR date and would like to wait at least 6 weeks after the cold  knife conization for complete healing.  Future follow-up will be determined based on her final pathology results.   Elisabet Gutzmer Isidor Constable, MD    Cc: Garnette Leonce, MD 12/30/2023 9:55 AM

## 2024-01-06 ENCOUNTER — Ambulatory Visit

## 2024-01-20 ENCOUNTER — Inpatient Hospital Stay (HOSPITAL_BASED_OUTPATIENT_CLINIC_OR_DEPARTMENT_OTHER): Admitting: Obstetrics and Gynecology

## 2024-01-20 VITALS — BP 143/74 | HR 71 | Temp 98.6°F | Resp 20 | Wt 219.1 lb

## 2024-01-20 DIAGNOSIS — C539 Malignant neoplasm of cervix uteri, unspecified: Secondary | ICD-10-CM

## 2024-01-20 NOTE — Progress Notes (Signed)
 Gynecologic Oncology Interval Visit  RMD: Dr. Garnette Mace  Chief complaints: counseling for stage IA2 cervical adenocarcinoma.  History of Present Illness: Theresa Davidson is a 43 y.o. G1P1 female who presents for management of stage IA2 cervical adenocarcinoma.  On 12/09/2023 Cold Knife Conization of Cervix and removal of Intrauterine device.   FINAL DIAGNOSIS       1. Cervix, cone, Cone biopsy stitch at 12 o'clock :      - ENDOCERVICAL ADENOCARCINOMA INVOLVING 9:00-12:00 AND 12:00-3:00 QUADRANTS.      - DEPTH OF INVASION: 3.0 MM.      - ENDOCERVICAL ADENOCARCINOMA IN SITU INVOLVING 6:00-9:00, 9:00-12:00, AND      12:00-3:00 QUADRANTS.      - HIGH-GRADE SQUAMOUS INTRAEPITHELIAL LESION (HSIL/CIN-3) INVOLVING 6:00-9:00      QUADRANT.      - ALL MARGINS ARE NEGATIVE FOR DYSPLASIA AND MALIGNANCY.      - SEE NOTE.       2. Endocervix, curettage,  :      - ENDOCERVIX WITH THERMAL ARTIFACT.      - DYSPLASIA AND MALIGNANCY ARE NOT IDENTIFIED.       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       Based on pathology she has IA1 Measured stromal invasion <=3 mm in depth.   Outside pathology [facility] review A.  Outside case, [phone], [facility], [place], [place].  Date of procedure 12/09/23:    1.  Cervix, cold knife cone excision:   Invasive endocervical adenocarcinoma, HPV associated, with associated endocervical adenocarcinoma in situ and focal high grade squamous intraepithelial lesion (HSIL)/cervical intraepithelial neoplasia-2 (CIN-2). Depth of invasion: 4 mm. Horizontal size: 10 mm. Lymphatic/vascular invasion: Absent. Silva pattern B.  All margins of resection are negative for invasive and in situ carcinoma.   2.  Endocervix, curettage:   Fragments of endocervix. Negative for dysplasia or  carcinoma.   Comment: Per the outside report, a p16 immunohistochemical stain on a prior cervical biopsy was positive (slide not submitted for review)  She does not desire future fertility.    Gynecologic Oncology History  Theresa Davidson is a pleasant G1P1 female who has cervical adenocarcinoma. She intially presented with cervical adenocarcinoma in situ.   09/16/23 seen for annual Gyn exam.  No bleeding or discharge.     Pap History:  2016- NILM, hpv not performed 09/08/2017- NILM, hpv not performed 12/10/2020- NILM, HPV Positive (12 mo f/u rec) 09/16/23- Endocervical adenocarcinoma in situ, HPV Positive. Genotype not performed   10/19/23- Cervix was reddened, lobular, irregular surface exophytic lesion w/o regular borders emanating from edge of cervix. Blue outline is more regular shaped lesion similar in appearance. Colposcopy performed with aceto-white lesions at 10-11, 8, 5:00. Biopsies performed. ECC performed.    Cervical Biopsy, 2:00: ENDOCERVICAL ADENOCARCINOMA-IN-SITU.  Cervical Biopsy, 5:00: LOW-GRADE SQUAMOUS INTRAEPITHELIAL LESION (CIN 1).  Cervical Biopsy, 8:00: ENDOCERVICAL ADENOCARCINOMA-IN-SITU.  Cervical Biopsy,10:00: ENDOCERVICAL ADENOCARCINOMA-IN-SITU.  Endocervical Curettings: ENDOCERVICAL ADENOCARCINOMA-IN-SITU.    PAP 2022 negative, HPV HR+   PAP 3/19 normal    IUD for contraception  Cold Knife Conization was recommended.   Past Medical History:  Diagnosis Date   Adenocarcinoma in situ of endocervix    Asymptomatic varicose veins of left lower extremity    Axillary hidradenitis suppurativa    Bilateral carpal tunnel syndrome    a.) (+) associated paraesthesias to BILATERAL hands  Hypertension    blood pressure up and down since pregnancy   Joint pain    Migraines    Raynaud's disease without gangrene    Past Surgical History:  Procedure Laterality Date   BREAST BIOPSY Left 12/11/2022   US  LT BREAST BX W LOC DEV 1ST LESION IMG BX SPEC US  GUIDE  12/11/2022 [facility]   CARPAL TUNNEL RELEASE Right 06/30/2018   Procedure: CARPAL TUNNEL RELEASE ENDOSCOPIC;  Surgeon: Edie Norleen PARAS, MD;  Location[facility];  Service: Orthopedics;  Laterality: Right;   CARPAL TUNNEL RELEASE Left 06/18/2021   Procedure: CARPAL TUNNEL RELEASE ENDOSCOPIC;  Surgeon: Edie Norleen PARAS, MD;  Location: [facility];  Service: Orthopedics;  Laterality: Left;   CERVICAL CONIZATION W/BX N/A 12/09/2023   Procedure: CONE BIOPSY, CERVIX; ENDOCERVICAL CURETTINGS;  Surgeon: Leonce Garnette BIRCH, MD;  Location: [facility];  Service: Gynecology;  Laterality: N/A;   OB History  Gravida Para Term Preterm AB Living  1 1 1   1   SAB IAB Ectopic Multiple Live Births     0 1    # Outcome Date GA Lbr Len/2nd Weight Sex Type Anes PTL Lv  1 Term 01/26/18 [redacted]w[redacted]d / 02:42 6 lb 9.8 oz (3 kg) M Vag-Spont EPI  LIV  Patient denies any other pertinent gynecologic issues.   Current Outpatient Medications on File Prior to Visit  Medication Sig Dispense Refill   BIOTIN PO Take 1 tablet by mouth daily.     Digestive Enzymes (DIGESTIVE ENZYME PO) Take 1 tablet by mouth daily.     HYDROcodone -acetaminophen  (NORCO/VICODIN) 5-325 MG tablet Take 1 tablet by mouth every 6 (six) hours as needed. 10 tablet 0   Magnesium Chloride (MAGNESIUM DR PO) Take 1 tablet by mouth daily.     meloxicam (MOBIC) 15 MG tablet Take 15 mg by mouth as needed for pain.     OIL OF OREGANO PO Take 1 capsule by mouth daily.     zonisamide (ZONEGRAN) 100 MG capsule Take 100 mg by mouth daily.     No current facility-administered medications on file prior to visit.   Allergies  Allergen Reactions   Penicillins Other (See Comments)    Doesn't know symptoms last had it as a child  As a child    Social History:   reports that she has quit smoking. Her smoking use included cigarettes. She has never used smokeless tobacco. She reports that she does not currently use alcohol. She reports that she does not use  drugs.  Family History  Problem Relation Age of Onset   Breast cancer Neg Hx     Review of Systems:  General: no complaints  HEENT: no complaints  Lungs: no complaints  Cardiac: no complaints  GI: decreased appetite o/w no complaints  GU: no complaints GYN: vaginal bleeding due to menses  Musculoskeletal: no complaints  Extremities: no complaints  Skin: no complaints  Neuro: no complaints  Endocrine: no complaints  Psych: no complaints       PHYSICAL EXAM: Blood pressure (!) 143/74, pulse 71, temperature 98.6 F (37 C), resp. rate 20, weight 219 lb 1.6 oz (99.4 kg), SpO2 100%.   ECOG - 0   GENERAL: Patient is a well appearing female in no acute distress HEENT:  PERRL, neck supple with midline trachea. Thyroid without masses.  NODES:  No cervical, supraclavicular, axillary, or inguinal lymphadenopathy palpated.  LUNGS:  Clear to auscultation bilaterally.   HEART:  Regular rate and rhythm.  ABDOMEN:  Soft,  nontender. Nondistended. No ascites or masses.  EXTREMITIES:  No peripheral edema.   SKIN:  Clear with no obvious rashes or skin changes.  NEURO:  Nonfocal. Well oriented.  Appropriate affect.  Pelvic: Chaperoned by CMA EGBUS: no lesions Cervix: no lesions, nontender, mobile.  Adequately healed after the cone.  Firm to palpation Vagina: no lesions, no discharge or bleeding Uterus: normal size, nontender, mobile Adnexa: no palpable masses    Labs Lab Results  Component Value Date   WBC 7.2 12/09/2023   HGB 14.3 12/09/2023   HCT 41.9 12/09/2023   MCV 85.9 12/09/2023   PLT 242 12/09/2023   09/16/2023 Component Ref Range & Units 3 mo ago  Glucose 70 - 110 mg/dL 90  Sodium 863 - 854 mmol/L 138  Potassium 3.6 - 5.1 mmol/L 3.9  Chloride 97 - 109 mmol/L 103  Carbon Dioxide (CO2) 22.0 - 32.0 mmol/L 27.4  Urea Nitrogen (BUN) 7 - 25 mg/dL 18  Creatinine 0.6 - 1.1 mg/dL 0.8  Glomerular Filtration Rate (eGFR) >60 mL/min/1.73sq m 94   Calcium 8.7 -  10.3 mg/dL 9.4  AST 8 - 39 U/L 16  ALT 5 - 38 U/L 23  Alk Phos (alkaline Phosphatase) 34 - 104 U/L 78  Albumin 3.5 - 4.8 g/dL 4.6  Bilirubin, Total 0.3 - 1.2 mg/dL 0.6  Protein, Total 6.1 - 7.9 g/dL 7.2  A/G Ratio 1.0 - 5.0 gm/dL 1.8      Assessment: Stage 1A2 Adenocarcinoma of the cervix with 4 mm invasive, no LVSI s/p CKC and IUD removal. She does not desire future fertility   Plan: Dr. Mancil has reviewed her [facility] pathology review and recommends simple hysterectomy with sentinel lymph node injection, mapping biopsy.  He has spoken with Dr. Nyla a leading expert in the field who agrees with this plan. Given that she has stage I A2 disease with stromal invasion 4 mm and horizontal spread of  10 mm she is a candidate for simple hysterectomy.  There is no evidence of lymph vascular positivity.  She does not desire future fertility.  We reviewed her pathology results in detail, the findings from the [facility] trial, benefits/risks of laparotomy versus minimally invasive surgery, possible increased risk of recurrence with MIS and provided recommendations from Dr. Mancil.  In addition to total hysterectomy I also recommended opportunistic bilateral salpingectomy.  I recommended retention of her ovaries given her young age and the very small lesion that she had with early stage disease.  She had an opportunity to ask questions and answers were provided accordingly.    Posted for exam under anesthesia, RA-TLH, BSx, and SLN, with Dr Elby on 01/27/2024.    Risks were discussed and/or presented on consent in detail. These include infection, anesthesia, bleeding, transfusion, wound separation, vaginal cuff dehiscence, recurrence, medical issues (blood clots, stroke, heart attack, fluid in the lungs, pneumonia, abnormal heart rhythm, death), recurrence, possible exploratory surgery with larger incision, risks associated with node dissection allergic reaction, injury to adjacent organs (bowel,  bladder, blood vessels, nerves, ureters, uterus).   Plan for follow up 4-6 weeks after clinic.     Body mass index is 37.61 kg/m.   Risk Points:  Age 95-60 yrs old   BMI 30+   Cancer diagnosis, not disseminated   RECOMMENDED PROPHYLAXIS:  One preoperative dose unfractionated heparin AND sequential compression devices   Taziyah Iannuzzi Isidor Elby, MD    Cc: Garnette Mace, MD 01/20/2024 10:28 AM

## 2024-01-20 NOTE — Patient Instructions (Signed)
 Your surgery will be scheduled January 27, 2024 You will have a pre-admission telephone visit prior to surgery. This visit will take around 40-45 minutes. Gyn oncology will see you 4-6 weeks following surgery for a post operative visit.                  Laparoscopy Laparoscopy is a procedure to diagnose diseases in the abdomen. During the procedure, a thin, lighted, pencil-sized instrument called a laparoscope is inserted into the abdomen through an incision. The laparoscope allows your health care provider to look at the organs inside your body. LET [facility] CARE PROVIDER KNOW ABOUT: Any allergies you have. All medicines you are taking, including vitamins, herbs, eye drops, creams, and over-the-counter medicines. Previous problems you or members of your family have had with the use of anesthetics. Any blood disorders you have. Previous surgeries you have had. Medical conditions you have. RISKS AND COMPLICATIONS  Generally, this is a safe procedure. However, problems can occur, which may include: Infection. Bleeding. Damage to other organs. Allergic reaction to the anesthetics used during the procedure. BEFORE THE PROCEDURE Do not eat or drink anything after midnight on the night before the procedure or as directed by your health care provider. Ask your health care provider about: Changing or stopping your regular medicines. Taking medicines such as aspirin and ibuprofen . These medicines can thin your blood. Do not take these medicines before your procedure if your health care provider instructs you not to. Plan to have someone take you home after the procedure. PROCEDURE You may be given a medicine to help you relax (sedative). You will be given a medicine to make you sleep (general anesthetic). Your abdomen will be inflated with a gas. This will make your organs easier to see. Small incisions will be made in your abdomen. A laparoscope and other small instruments will be  inserted into the abdomen through the incisions. A tissue sample may be removed from an organ in the abdomen for examination. The instruments will be removed from the abdomen. The gas will be released. The incisions will be closed with stitches (sutures). AFTER THE PROCEDURE  Your blood pressure, heart rate, breathing rate, and blood oxygen level will be monitored often until the medicines you were given have worn off.   This information is not intended to replace advice given to you by your health care provider. Make sure you discuss any questions you have with your health care provider.                                             Bowel Symptoms After Surgery After gynecologic surgery, women often have temporary changes in bowel function (constipation and gas pain).  Following are tips to help prevent and treat common bowel problems.  It also tells you when to call the doctor.  This is important because some symptoms might be a sign of a more serious bowel problem such as obstruction (bowel blockage).  These problems are rare but can happen after gynecologic surgery.   Besides surgery, what can temporarily affect bowel function? 1. Dietary changes   2. Decreased physical activity   3.Antibiotics   4. Pain medication   How can I prevent constipation (three days or more without a stool)? Include fiber in your diet: whole grains, raw or dried fruits & vegetables, prunes, prune/pear juiceDrink at least  8 glasses of liquid (preferably water) every day Avoid: Gas forming foods such as broccoli, beans, peas, salads, cabbage, sweet potatoes Greasy, fatty, or fried foods Activity helps bowel function return to normal, walk around the house at least 3-4 times each day for 15 minutes or longer, if tolerated.  Rocking in a rocking chair is preferable to sitting still. Stool softeners: these are not laxatives, but serve to soften the stool to avoid straining.  Take 2-4 times a day until normal bowel  function returns         Examples: Colace or generic equivalent (Docusate) Bulk laxatives: provide a concentrated source of fiber.  They do not stimulate the bowel.  Take 1-2 times each day until normal bowel function return.              Examples: Citrucel, Metamucil, Fiberal, Fibercon   What can I take for "Gas Pains"? Simethicone (Mylicon, Gas-X, Maalox-Gas, Mylanta-Gas) take 3-4 times a day Maalox Regular - take 3-4 times a day Mylanta Regular - take 3-4 times a day   What can I take if I become constipated? Start with stool softeners and add additional laxatives below as needed to have a bowel movement every 1-2 days  Stool softeners 1-2 tablets, 2 times a day Senokot 1-2 tablets, 1-2 times a day Glycerin suppository can soften hard stool take once a day Bisacodyl suppository once a day  Milk of Magnesia 30 mL 1-2 times a day Fleets or tap water enema    What can I do for nausea?  Limit most solid foods for 24-48 hours Continue eating small frequent amounts of liquids and/or bland soft foods Toast, crackers, cooked cereal (grits, cream of wheat, rice) Benadryl: a mild anti-nausea medicine can be obtained without a prescription. May cause drowsiness, especially if taken with narcotic pain medicines Contact provider for prescription nausea medication     What can I do, or take for diarrhea (more than five loose stools per day)? Drink plenty of clear fluids to prevent dehydration May take Kaopectate, Pepto-Bismol, Imodium, or probiotics for 1-2 days Anusol or Preparation-H can be helpful for hemorrhoids and irritated tissue around anus   When should I call the doctor?             CONSTIPATION:  Not relieved after three days following the above program VOMITING: That contains blood, "coffee ground" material More the three times/hour and unable to keep down nausea medication for more than eight hours With dry mouth, dark or strong urine, feeling light-headed, dizzy, or  confused With severe abdominal pain or bloating for more than 24 hours DIARRHEA: That continues for more then 24-48 hours despite treatment That contains blood or tarry material With dry mouth, dark or strong urine, feeling light~headed, dizzy, or confused FEVER: 101 F or higher along with nausea, vomiting, gas pain, diarrhea UNABLE TO: Pass gas from rectum for more than 24 hours Tolerate liquids by mouth for more than 24 hours        Laparoscopic Hysterectomy, Care After Refer to this sheet in the next few weeks. These instructions provide you with information on caring for yourself after your procedure. Your health care provider may also give you more specific instructions. Your treatment has been planned according to current medical practices, but problems sometimes occur. Call your health care provider if you have any problems or questions after your procedure. What can I expect after the procedure? Pain and bruising at the incision sites. You will be given pain  medicine to control it. Menopausal symptoms such as hot flashes, night sweats, and insomnia if your ovaries were removed. Sore throat from the breathing tube that was inserted during surgery. Follow these instructions at home: Only take over-the-counter or prescription medicines for pain, discomfort, or fever as directed by your health care provider. Do not take aspirin. It can cause bleeding. Do not drive when taking pain medicine. Follow your health care provider's advice regarding diet, exercise, lifting, driving, and general activities. Resume your usual diet as directed and allowed. Get plenty of rest and sleep. Do not douche, use tampons, or have sexual intercourse for at least 6 weeks, or until your health care provider gives you permission. Change your bandages (dressings) as directed by your health care provider. Monitor your temperature and notify your health care provider of a fever. Take showers instead of baths  for 2-3 weeks. Do not drink alcohol until your health care provider gives you permission. If you develop constipation, you may take a mild laxative with your health care provider's permission. Bran foods may help with constipation problems. Drinking enough fluids to keep your urine clear or pale yellow may help as well. Try to have someone home with you for 1-2 weeks to help around the house. Keep all of your follow-up appointments as directed by your health care provider. Contact a health care provider if: You have swelling, redness, or increasing pain around your incision sites. You have pus coming from your incision. You notice a bad smell coming from your incision. Your incision breaks open. You feel dizzy or lightheaded. You have pain or bleeding when you urinate. You have persistent diarrhea. You have persistent nausea and vomiting. You have abnormal vaginal discharge. You have a rash. You have any type of abnormal reaction or develop an allergy to your medicine. You have poor pain control with your prescribed medicine. Get help right away if: You have chest pain or shortness of breath. You have severe abdominal pain that is not relieved with pain medicine. You have pain or swelling in your legs. This information is not intended to replace advice given to you by your health care provider. Make sure you discuss any questions you have with your health care provider. Document Released: 03/30/2013 Document Revised: 11/15/2015 Document Reviewed: 12/28/2012 Elsevier Interactive Patient Education  2017 [employer].

## 2024-01-20 NOTE — Addendum Note (Signed)
 Addended by: Mataio Mele G on: 01/20/2024 03:24 PM   Modules accepted: Orders

## 2024-01-20 NOTE — H&P (View-Only) (Signed)
 Gynecologic Oncology Interval Visit  RMD: Dr. Garnette Mace  Chief complaints: counseling for stage IA2 cervical adenocarcinoma.  History of Present Illness: Theresa Davidson is a 43 y.o. G1P1 female who presents for management of stage IA2 cervical adenocarcinoma.  On 12/09/2023 Cold Knife Conization of Cervix and removal of Intrauterine device.   FINAL DIAGNOSIS       1. Cervix, cone, Cone biopsy stitch at 12 o'clock :      - ENDOCERVICAL ADENOCARCINOMA INVOLVING 9:00-12:00 AND 12:00-3:00 QUADRANTS.      - DEPTH OF INVASION: 3.0 MM.      - ENDOCERVICAL ADENOCARCINOMA IN SITU INVOLVING 6:00-9:00, 9:00-12:00, AND      12:00-3:00 QUADRANTS.      - HIGH-GRADE SQUAMOUS INTRAEPITHELIAL LESION (HSIL/CIN-3) INVOLVING 6:00-9:00      QUADRANT.      - ALL MARGINS ARE NEGATIVE FOR DYSPLASIA AND MALIGNANCY.      - SEE NOTE.       2. Endocervix, curettage,  :      - ENDOCERVIX WITH THERMAL ARTIFACT.      - DYSPLASIA AND MALIGNANCY ARE NOT IDENTIFIED.       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       Based on pathology she has IA1 Measured stromal invasion <=3 mm in depth.   Outside pathology [facility] review A.  Outside case, [phone], [facility], [place], [place].  Date of procedure 12/09/23:    1.  Cervix, cold knife cone excision:   Invasive endocervical adenocarcinoma, HPV associated, with associated endocervical adenocarcinoma in situ and focal high grade squamous intraepithelial lesion (HSIL)/cervical intraepithelial neoplasia-2 (CIN-2). Depth of invasion: 4 mm. Horizontal size: 10 mm. Lymphatic/vascular invasion: Absent. Silva pattern B.  All margins of resection are negative for invasive and in situ carcinoma.   2.  Endocervix, curettage:   Fragments of endocervix. Negative for dysplasia or  carcinoma.   Comment: Per the outside report, a p16 immunohistochemical stain on a prior cervical biopsy was positive (slide not submitted for review)  She does not desire future fertility.    Gynecologic Oncology History  Theresa Davidson is a pleasant G1P1 female who has cervical adenocarcinoma. She intially presented with cervical adenocarcinoma in situ.   09/16/23 seen for annual Gyn exam.  No bleeding or discharge.     Pap History:  2016- NILM, hpv not performed 09/08/2017- NILM, hpv not performed 12/10/2020- NILM, HPV Positive (12 mo f/u rec) 09/16/23- Endocervical adenocarcinoma in situ, HPV Positive. Genotype not performed   10/19/23- Cervix was reddened, lobular, irregular surface exophytic lesion w/o regular borders emanating from edge of cervix. Blue outline is more regular shaped lesion similar in appearance. Colposcopy performed with aceto-white lesions at 10-11, 8, 5:00. Biopsies performed. ECC performed.    Cervical Biopsy, 2:00: ENDOCERVICAL ADENOCARCINOMA-IN-SITU.  Cervical Biopsy, 5:00: LOW-GRADE SQUAMOUS INTRAEPITHELIAL LESION (CIN 1).  Cervical Biopsy, 8:00: ENDOCERVICAL ADENOCARCINOMA-IN-SITU.  Cervical Biopsy,10:00: ENDOCERVICAL ADENOCARCINOMA-IN-SITU.  Endocervical Curettings: ENDOCERVICAL ADENOCARCINOMA-IN-SITU.    PAP 2022 negative, HPV HR+   PAP 3/19 normal    IUD for contraception  Cold Knife Conization was recommended.   Past Medical History:  Diagnosis Date   Adenocarcinoma in situ of endocervix    Asymptomatic varicose veins of left lower extremity    Axillary hidradenitis suppurativa    Bilateral carpal tunnel syndrome    a.) (+) associated paraesthesias to BILATERAL hands  Hypertension    blood pressure up and down since pregnancy   Joint pain    Migraines    Raynaud's disease without gangrene    Past Surgical History:  Procedure Laterality Date   BREAST BIOPSY Left 12/11/2022   US  LT BREAST BX W LOC DEV 1ST LESION IMG BX SPEC US  GUIDE  12/11/2022 [facility]   CARPAL TUNNEL RELEASE Right 06/30/2018   Procedure: CARPAL TUNNEL RELEASE ENDOSCOPIC;  Surgeon: Edie Norleen PARAS, MD;  Location: [facility];  Service: Orthopedics;  Laterality: Right;   CARPAL TUNNEL RELEASE Left 06/18/2021   Procedure: CARPAL TUNNEL RELEASE ENDOSCOPIC;  Surgeon: Edie Norleen PARAS, MD;  Location: [facility];  Service: Orthopedics;  Laterality: Left;   CERVICAL CONIZATION W/BX N/A 12/09/2023   Procedure: CONE BIOPSY, CERVIX; ENDOCERVICAL CURETTINGS;  Surgeon: Leonce Garnette BIRCH, MD;  Location: [facility];  Service: Gynecology;  Laterality: N/A;   OB History  Gravida Para Term Preterm AB Living  1 1 1   1   SAB IAB Ectopic Multiple Live Births     0 1    # Outcome Date GA Lbr Len/2nd Weight Sex Type Anes PTL Lv  1 Term 01/26/18 [redacted]w[redacted]d / 02:42 6 lb 9.8 oz (3 kg) M Vag-Spont EPI  LIV  Patient denies any other pertinent gynecologic issues.   Current Outpatient Medications on File Prior to Visit  Medication Sig Dispense Refill   BIOTIN PO Take 1 tablet by mouth daily.     Digestive Enzymes (DIGESTIVE ENZYME PO) Take 1 tablet by mouth daily.     HYDROcodone -acetaminophen  (NORCO/VICODIN) 5-325 MG tablet Take 1 tablet by mouth every 6 (six) hours as needed. 10 tablet 0   Magnesium Chloride (MAGNESIUM DR PO) Take 1 tablet by mouth daily.     meloxicam (MOBIC) 15 MG tablet Take 15 mg by mouth as needed for pain.     OIL OF OREGANO PO Take 1 capsule by mouth daily.     zonisamide (ZONEGRAN) 100 MG capsule Take 100 mg by mouth daily.     No current facility-administered medications on file prior to visit.   Allergies  Allergen Reactions   Penicillins Other (See Comments)    Doesn't know symptoms last had it as a child  As a child    Social History:   reports that she has quit smoking. Her smoking use included cigarettes. She has never used smokeless tobacco. She reports that she does not currently use alcohol. She reports that she does not use  drugs.  Family History  Problem Relation Age of Onset   Breast cancer Neg Hx     Review of Systems:  General: no complaints  HEENT: no complaints  Lungs: no complaints  Cardiac: no complaints  GI: decreased appetite o/w no complaints  GU: no complaints GYN: vaginal bleeding due to menses  Musculoskeletal: no complaints  Extremities: no complaints  Skin: no complaints  Neuro: no complaints  Endocrine: no complaints  Psych: no complaints       PHYSICAL EXAM: Blood pressure (!) 143/74, pulse 71, temperature 98.6 F (37 C), resp. rate 20, weight 219 lb 1.6 oz (99.4 kg), SpO2 100%.   ECOG - 0   GENERAL: Patient is a well appearing female in no acute distress HEENT:  PERRL, neck supple with midline trachea. Thyroid without masses.  NODES:  No cervical, supraclavicular, axillary, or inguinal lymphadenopathy palpated.  LUNGS:  Clear to auscultation bilaterally.   HEART:  Regular rate and rhythm.  ABDOMEN:  Soft,  nontender. Nondistended. No ascites or masses.  EXTREMITIES:  No peripheral edema.   SKIN:  Clear with no obvious rashes or skin changes.  NEURO:  Nonfocal. Well oriented.  Appropriate affect.  Pelvic: Chaperoned by CMA EGBUS: no lesions Cervix: no lesions, nontender, mobile.  Adequately healed after the cone.  Firm to palpation Vagina: no lesions, no discharge or bleeding Uterus: normal size, nontender, mobile Adnexa: no palpable masses    Labs Lab Results  Component Value Date   WBC 7.2 12/09/2023   HGB 14.3 12/09/2023   HCT 41.9 12/09/2023   MCV 85.9 12/09/2023   PLT 242 12/09/2023   09/16/2023 Component Ref Range & Units 3 mo ago  Glucose 70 - 110 mg/dL 90  Sodium 863 - 854 mmol/L 138  Potassium 3.6 - 5.1 mmol/L 3.9  Chloride 97 - 109 mmol/L 103  Carbon Dioxide (CO2) 22.0 - 32.0 mmol/L 27.4  Urea Nitrogen (BUN) 7 - 25 mg/dL 18  Creatinine 0.6 - 1.1 mg/dL 0.8  Glomerular Filtration Rate (eGFR) >60 mL/min/1.73sq m 94   Calcium 8.7 -  10.3 mg/dL 9.4  AST 8 - 39 U/L 16  ALT 5 - 38 U/L 23  Alk Phos (alkaline Phosphatase) 34 - 104 U/L 78  Albumin 3.5 - 4.8 g/dL 4.6  Bilirubin, Total 0.3 - 1.2 mg/dL 0.6  Protein, Total 6.1 - 7.9 g/dL 7.2  A/G Ratio 1.0 - 5.0 gm/dL 1.8      Assessment: Stage 1A2 Adenocarcinoma of the cervix with 4 mm invasive, no LVSI s/p CKC and IUD removal. She does not desire future fertility   Plan: Dr. Mancil has reviewed her [facility] pathology review and recommends simple hysterectomy with sentinel lymph node injection, mapping biopsy.  He has spoken with Dr. Nyla a leading expert in the field who agrees with this plan. Given that she has stage I A2 disease with stromal invasion 4 mm and horizontal spread of  10 mm she is a candidate for simple hysterectomy.  There is no evidence of lymph vascular positivity.  She does not desire future fertility.  We reviewed her pathology results in detail, the findings from the [facility] trial, benefits/risks of laparotomy versus minimally invasive surgery, possible increased risk of recurrence with MIS and provided recommendations from Dr. Mancil.  In addition to total hysterectomy I also recommended opportunistic bilateral salpingectomy.  I recommended retention of her ovaries given her young age and the very small lesion that she had with early stage disease.  She had an opportunity to ask questions and answers were provided accordingly.    Posted for exam under anesthesia, RA-TLH, BSx, and SLN, with Dr Elby on 01/27/2024.    Risks were discussed and/or presented on consent in detail. These include infection, anesthesia, bleeding, transfusion, wound separation, vaginal cuff dehiscence, recurrence, medical issues (blood clots, stroke, heart attack, fluid in the lungs, pneumonia, abnormal heart rhythm, death), recurrence, possible exploratory surgery with larger incision, risks associated with node dissection allergic reaction, injury to adjacent organs (bowel,  bladder, blood vessels, nerves, ureters, uterus).   Plan for follow up 4-6 weeks after clinic.     Body mass index is 37.61 kg/m.   Risk Points:  Age 95-60 yrs old   BMI 30+   Cancer diagnosis, not disseminated   RECOMMENDED PROPHYLAXIS:  One preoperative dose unfractionated heparin AND sequential compression devices   Taziyah Iannuzzi Isidor Elby, MD    Cc: Garnette Mace, MD 01/20/2024 10:28 AM

## 2024-01-20 NOTE — H&P (Signed)
 Gynecologic Oncology Interval Visit  RMD: Dr. Garnette Mace  Chief complaints: counseling for stage IA2 cervical adenocarcinoma.  History of Present Illness: Theresa Davidson is a 43 y.o. G1P1 female who presents for management of stage IA2 cervical adenocarcinoma.  On 12/09/2023 Cold Knife Conization of Cervix and removal of Intrauterine device.   FINAL DIAGNOSIS       1. Cervix, cone, Cone biopsy stitch at 12 o'clock :      - ENDOCERVICAL ADENOCARCINOMA INVOLVING 9:00-12:00 AND 12:00-3:00 QUADRANTS.      - DEPTH OF INVASION: 3.0 MM.      - ENDOCERVICAL ADENOCARCINOMA IN SITU INVOLVING 6:00-9:00, 9:00-12:00, AND      12:00-3:00 QUADRANTS.      - HIGH-GRADE SQUAMOUS INTRAEPITHELIAL LESION (HSIL/CIN-3) INVOLVING 6:00-9:00      QUADRANT.      - ALL MARGINS ARE NEGATIVE FOR DYSPLASIA AND MALIGNANCY.      - SEE NOTE.       2. Endocervix, curettage,  :      - ENDOCERVIX WITH THERMAL ARTIFACT.      - DYSPLASIA AND MALIGNANCY ARE NOT IDENTIFIED.       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       Based on pathology she has IA1 Measured stromal invasion <=3 mm in depth.   Outside pathology [facility] review A.  Outside case, [phone], [facility], [place], [place].  Date of procedure 12/09/23:    1.  Cervix, cold knife cone excision:   Invasive endocervical adenocarcinoma, HPV associated, with associated endocervical adenocarcinoma in situ and focal high grade squamous intraepithelial lesion (HSIL)/cervical intraepithelial neoplasia-2 (CIN-2). Depth of invasion: 4 mm. Horizontal size: 10 mm. Lymphatic/vascular invasion: Absent. Silva pattern B.  All margins of resection are negative for invasive and in situ carcinoma.   2.  Endocervix, curettage:   Fragments of endocervix. Negative for dysplasia or  carcinoma.   Comment: Per the outside report, a p16 immunohistochemical stain on a prior cervical biopsy was positive (slide not submitted for review)  She does not desire future fertility.    Gynecologic Oncology History  Theresa Davidson is a pleasant G1P1 female who has cervical adenocarcinoma. She intially presented with cervical adenocarcinoma in situ.   09/16/23 seen for annual Gyn exam.  No bleeding or discharge.     Pap History:  2016- NILM, hpv not performed 09/08/2017- NILM, hpv not performed 12/10/2020- NILM, HPV Positive (12 mo f/u rec) 09/16/23- Endocervical adenocarcinoma in situ, HPV Positive. Genotype not performed   10/19/23- Cervix was reddened, lobular, irregular surface exophytic lesion w/o regular borders emanating from edge of cervix. Blue outline is more regular shaped lesion similar in appearance. Colposcopy performed with aceto-white lesions at 10-11, 8, 5:00. Biopsies performed. ECC performed.    Cervical Biopsy, 2:00: ENDOCERVICAL ADENOCARCINOMA-IN-SITU.  Cervical Biopsy, 5:00: LOW-GRADE SQUAMOUS INTRAEPITHELIAL LESION (CIN 1).  Cervical Biopsy, 8:00: ENDOCERVICAL ADENOCARCINOMA-IN-SITU.  Cervical Biopsy,10:00: ENDOCERVICAL ADENOCARCINOMA-IN-SITU.  Endocervical Curettings: ENDOCERVICAL ADENOCARCINOMA-IN-SITU.    PAP 2022 negative, HPV HR+   PAP 3/19 normal    IUD for contraception  Cold Knife Conization was recommended.   Past Medical History:  Diagnosis Date   Adenocarcinoma in situ of endocervix    Asymptomatic varicose veins of left lower extremity    Axillary hidradenitis suppurativa    Bilateral carpal tunnel syndrome    a.) (+) associated paraesthesias to BILATERAL hands  Hypertension    blood pressure up and down since pregnancy   Joint pain    Migraines    Raynaud's disease without gangrene    Past Surgical History:  Procedure Laterality Date   BREAST BIOPSY Left 12/11/2022   US  LT BREAST BX W LOC DEV 1ST LESION IMG BX SPEC US  GUIDE  12/11/2022 [facility]   CARPAL TUNNEL RELEASE Right 06/30/2018   Procedure: CARPAL TUNNEL RELEASE ENDOSCOPIC;  Surgeon: Edie Norleen PARAS, MD;  Location: [facility];  Service: Orthopedics;  Laterality: Right;   CARPAL TUNNEL RELEASE Left 06/18/2021   Procedure: CARPAL TUNNEL RELEASE ENDOSCOPIC;  Surgeon: Edie Norleen PARAS, MD;  Location: [facility];  Service: Orthopedics;  Laterality: Left;   CERVICAL CONIZATION W/BX N/A 12/09/2023   Procedure: CONE BIOPSY, CERVIX; ENDOCERVICAL CURETTINGS;  Surgeon: Leonce Garnette BIRCH, MD;  Location: [facility];  Service: Gynecology;  Laterality: N/A;   OB History  Gravida Para Term Preterm AB Living  1 1 1   1   SAB IAB Ectopic Multiple Live Births     0 1    # Outcome Date GA Lbr Len/2nd Weight Sex Type Anes PTL Lv  1 Term 01/26/18 [redacted]w[redacted]d / 02:42 6 lb 9.8 oz (3 kg) M Vag-Spont EPI  LIV  Patient denies any other pertinent gynecologic issues.   Current Outpatient Medications on File Prior to Visit  Medication Sig Dispense Refill   BIOTIN PO Take 1 tablet by mouth daily.     Digestive Enzymes (DIGESTIVE ENZYME PO) Take 1 tablet by mouth daily.     HYDROcodone -acetaminophen  (NORCO/VICODIN) 5-325 MG tablet Take 1 tablet by mouth every 6 (six) hours as needed. 10 tablet 0   Magnesium Chloride (MAGNESIUM DR PO) Take 1 tablet by mouth daily.     meloxicam (MOBIC) 15 MG tablet Take 15 mg by mouth as needed for pain.     OIL OF OREGANO PO Take 1 capsule by mouth daily.     zonisamide (ZONEGRAN) 100 MG capsule Take 100 mg by mouth daily.     No current facility-administered medications on file prior to visit.   Allergies  Allergen Reactions   Penicillins Other (See Comments)    Doesn't know symptoms last had it as a child  As a child    Social History:   reports that she has quit smoking. Her smoking use included cigarettes. She has never used smokeless tobacco. She reports that she does not currently use alcohol. She reports that she does not use  drugs.  Family History  Problem Relation Age of Onset   Breast cancer Neg Hx     Review of Systems:  General: no complaints  HEENT: no complaints  Lungs: no complaints  Cardiac: no complaints  GI: decreased appetite o/w no complaints  GU: no complaints GYN: vaginal bleeding due to menses  Musculoskeletal: no complaints  Extremities: no complaints  Skin: no complaints  Neuro: no complaints  Endocrine: no complaints  Psych: no complaints       PHYSICAL EXAM: Blood pressure (!) 143/74, pulse 71, temperature 98.6 F (37 C), resp. rate 20, weight 219 lb 1.6 oz (99.4 kg), SpO2 100%.   ECOG - 0   GENERAL: Patient is a well appearing female in no acute distress HEENT:  PERRL, neck supple with midline trachea. Thyroid without masses.  NODES:  No cervical, supraclavicular, axillary, or inguinal lymphadenopathy palpated.  LUNGS:  Clear to auscultation bilaterally.   HEART:  Regular rate and rhythm.  ABDOMEN:  Soft,  nontender. Nondistended. No ascites or masses.  EXTREMITIES:  No peripheral edema.   SKIN:  Clear with no obvious rashes or skin changes.  NEURO:  Nonfocal. Well oriented.  Appropriate affect.  Pelvic: Chaperoned by CMA EGBUS: no lesions Cervix: no lesions, nontender, mobile.  Adequately healed after the cone.  Firm to palpation Vagina: no lesions, no discharge or bleeding Uterus: normal size, nontender, mobile Adnexa: no palpable masses    Labs Lab Results  Component Value Date   WBC 7.2 12/09/2023   HGB 14.3 12/09/2023   HCT 41.9 12/09/2023   MCV 85.9 12/09/2023   PLT 242 12/09/2023   09/16/2023 Component Ref Range & Units 3 mo ago  Glucose 70 - 110 mg/dL 90  Sodium 863 - 854 mmol/L 138  Potassium 3.6 - 5.1 mmol/L 3.9  Chloride 97 - 109 mmol/L 103  Carbon Dioxide (CO2) 22.0 - 32.0 mmol/L 27.4  Urea Nitrogen (BUN) 7 - 25 mg/dL 18  Creatinine 0.6 - 1.1 mg/dL 0.8  Glomerular Filtration Rate (eGFR) >60 mL/min/1.73sq m 94   Calcium 8.7 -  10.3 mg/dL 9.4  AST 8 - 39 U/L 16  ALT 5 - 38 U/L 23  Alk Phos (alkaline Phosphatase) 34 - 104 U/L 78  Albumin 3.5 - 4.8 g/dL 4.6  Bilirubin, Total 0.3 - 1.2 mg/dL 0.6  Protein, Total 6.1 - 7.9 g/dL 7.2  A/G Ratio 1.0 - 5.0 gm/dL 1.8      Assessment: Stage 1A2 Adenocarcinoma of the cervix with 4 mm invasive, no LVSI s/p CKC and IUD removal. She does not desire future fertility   Plan: Dr. Mancil has reviewed her [facility] pathology review and recommends simple hysterectomy with sentinel lymph node injection, mapping biopsy.  He has spoken with Dr. Nyla a leading expert in the field who agrees with this plan. Given that she has stage I A2 disease with stromal invasion 4 mm and horizontal spread of  10 mm she is a candidate for simple hysterectomy.  There is no evidence of lymph vascular positivity.  She does not desire future fertility.  We reviewed her pathology results in detail, the findings from the [facility] trial, benefits/risks of laparotomy versus minimally invasive surgery, possible increased risk of recurrence with MIS and provided recommendations from Dr. Mancil.  In addition to total hysterectomy I also recommended opportunistic bilateral salpingectomy.  I recommended retention of her ovaries given her young age and the very small lesion that she had with early stage disease.  She had an opportunity to ask questions and answers were provided accordingly.    Posted for exam under anesthesia, RA-TLH, BSx, and SLN, with Dr Elby on 01/27/2024.    Risks were discussed and/or presented on consent in detail. These include infection, anesthesia, bleeding, transfusion, wound separation, vaginal cuff dehiscence, recurrence, medical issues (blood clots, stroke, heart attack, fluid in the lungs, pneumonia, abnormal heart rhythm, death), recurrence, possible exploratory surgery with larger incision, risks associated with node dissection allergic reaction, injury to adjacent organs (bowel,  bladder, blood vessels, nerves, ureters, uterus).   Plan for follow up 4-6 weeks after clinic.     Body mass index is 37.61 kg/m.   Risk Points:  Age 95-60 yrs old   BMI 30+   Cancer diagnosis, not disseminated   RECOMMENDED PROPHYLAXIS:  One preoperative dose unfractionated heparin AND sequential compression devices   Taziyah Iannuzzi Isidor Elby, MD    Cc: Garnette Mace, MD 01/20/2024 10:28 AM

## 2024-01-20 NOTE — Progress Notes (Signed)
 Met with patient to review pre-operative teaching for planned surgery scheduled for January 27, 2024. Pre-admit phone screen appointment discussed. Teaching included but not limited to labs, bowel prep and dietary restrictions, surgical location, pain scales/management, diligent peri care, guidelines to prevent constipation, and importance of early ambulation post operatively and follow-up care. Written instructions for pre-op and post-op care, contact information for questions and concerns provided to patient. Patient able to repeat instructions to nursing staff. No further questions at this time. Patient agrees with plan to proceed with scheduled procedure.

## 2024-01-22 ENCOUNTER — Encounter
Admission: RE | Admit: 2024-01-22 | Discharge: 2024-01-22 | Disposition: A | Discharge status: Home / Self Care | Source: Ambulatory Visit | Attending: Obstetrics and Gynecology | Admitting: Obstetrics and Gynecology

## 2024-01-22 ENCOUNTER — Other Ambulatory Visit: Payer: Self-pay

## 2024-01-22 VITALS — Ht 64.0 in

## 2024-01-22 DIAGNOSIS — Z01818 Encounter for other preprocedural examination: Secondary | ICD-10-CM

## 2024-01-22 HISTORY — DX: Sleep apnea, unspecified: G47.30

## 2024-01-22 HISTORY — DX: Anemia, unspecified: D64.9

## 2024-01-22 HISTORY — DX: Raynaud's syndrome without gangrene: I73.00

## 2024-01-22 HISTORY — DX: Gestational (pregnancy-induced) hypertension without significant proteinuria, unspecified trimester: O13.9

## 2024-01-22 NOTE — Patient Instructions (Addendum)
 Your procedure is scheduled on:01-27-24 Wednesday Report to the Registration Desk on the 1st floor of the [facility].Then proceed to the 2nd floor Surgery Desk To find out your arrival time, please call [phone] between 1PM - 3PM on:01-26-24 Tuesday If your arrival time is 6:00 am, do not arrive before that time as the [facility] entrance doors do not open until 6:00 am.  REMEMBER: Instructions that are not followed completely may result in serious medical risk, up to and including death; or upon the discretion of your surgeon and anesthesiologist your surgery may need to be rescheduled.  Do not eat food after midnight the night before surgery.  No gum chewing or hard candies.  You may however, drink CLEAR liquids up to 2 hours before you are scheduled to arrive for your surgery. Do not drink anything within 2 hours of your scheduled arrival time.  Clear liquids include: - water  - apple juice without pulp - gatorade (not RED colors) - black coffee or tea (Do NOT add milk or creamers to the coffee or tea) Do NOT drink anything that is not on this list.  In addition, your doctor has ordered for you to drink the provided:  Ensure Pre-Surgery Clear Carbohydrate Drink Drinking this carbohydrate drink up to two hours before surgery helps to reduce insulin resistance and improve patient outcomes. Please complete drinking 2 hours before scheduled arrival time.  One week prior to surgery:Stop NOW (01-22-24) Stop Anti-inflammatories (NSAIDS) such as Advil , Aleve, Ibuprofen , Motrin , Naproxen, Naprosyn and Aspirin based products such as Excedrin, Goody's Powder, BC Powder. Stop ANY OVER THE COUNTER supplements until after surgery.  You may however, continue to take Tylenol  if needed for pain up until the day of surgery.  Continue taking all of your other prescription medications up until the day of surgery.  ON THE DAY OF SURGERY ONLY TAKE THESE MEDICATIONS WITH SIPS OF WATER: -zonisamide  (ZONEGRAN)   No Alcohol for 24 hours before or after surgery.  No Smoking including e-cigarettes for 24 hours before surgery.  No chewable tobacco products for at least 6 hours before surgery.  No nicotine patches on the day of surgery.  Do not use any recreational drugs for at least a week (preferably 2 weeks) before your surgery.  Please be advised that the combination of cocaine and anesthesia may have negative outcomes, up to and including death. If you test positive for cocaine, your surgery will be cancelled.  On the morning of surgery brush your teeth with toothpaste and water, you may rinse your mouth with mouthwash if you wish. Do not swallow any toothpaste or mouthwash.  Use CHG Soap as directed on instruction sheet.  Do not wear jewelry, make-up, hairpins, clips or nail polish.  For welded (permanent) jewelry: bracelets, anklets, waist bands, etc.  Please have this removed prior to surgery.  If it is not removed, there is a chance that hospital personnel will need to cut it off on the day of surgery.  Do not wear lotions, powders, or perfumes.   Do not shave body hair from the neck down 48 hours before surgery.  Contact lenses, hearing aids and dentures may not be worn into surgery.  Do not bring valuables to the hospital. [facility] is not responsible for any missing/lost belongings or valuables.   Notify your doctor if there is any change in your medical condition (cold, fever, infection).  Wear comfortable clothing (specific to your surgery type) to the hospital.  After  surgery, you can help prevent lung complications by doing breathing exercises.  Take deep breaths and cough every 1-2 hours. Your doctor may order a device called an Incentive Spirometer to help you take deep breaths. When coughing or sneezing, hold a pillow firmly against your incision with both hands. This is called "splinting." Doing this helps protect your incision. It also decreases belly  discomfort.  If you are being admitted to the hospital overnight, leave your suitcase in the car. After surgery it may be brought to your room.  In case of increased patient census, it may be necessary for you, the patient, to continue your postoperative care in the Same Day Surgery department.  If you are being discharged the day of surgery, you will not be allowed to drive home. You will need a responsible individual to drive you home and stay with you for 24 hours after surgery.   If you are taking public transportation, you will need to have a responsible individual with you.  Please call the Pre-admissions Testing Dept. at [phone] if you have any questions about these instructions.  Surgery Visitation Policy:  Patients having surgery or a procedure may have two visitors.  Children under the age of 66 must have an adult with them who is not the patient.     Preparing for Surgery with CHLORHEXIDINE  GLUCONATE (CHG) Soap  Chlorhexidine  Gluconate (CHG) Soap  o An antiseptic cleaner that kills germs and bonds with the skin to continue killing germs even after washing  o Used for showering the night before surgery and morning of surgery  Before surgery, you can play an important role by reducing the number of germs on your skin.  CHG (Chlorhexidine  gluconate) soap is an antiseptic cleanser which kills germs and bonds with the skin to continue killing germs even after washing.  Please do not use if you have an allergy to CHG or antibacterial soaps. If your skin becomes reddened/irritated stop using the CHG.  1. Shower the NIGHT BEFORE SURGERY and the MORNING OF SURGERY with CHG soap.  2. If you choose to wash your hair, wash your hair first as usual with your normal shampoo.  3. After shampooing, rinse your hair and body thoroughly to remove the shampoo.  4. Use CHG as you would any other liquid soap. You can apply CHG directly to the skin and wash gently with a scrungie  or a clean washcloth.  5. Apply the CHG soap to your body only from the neck down. Do not use on open wounds or open sores. Avoid contact with your eyes, ears, mouth, and genitals (private parts). Wash face and genitals (private parts) with your normal soap.  6. Wash thoroughly, paying special attention to the area where your surgery will be performed.  7. Thoroughly rinse your body with warm water.  8. Do not shower/wash with your normal soap after using and rinsing off the CHG soap.  9. Pat yourself dry with a clean towel.  10. Wear clean pajamas to bed the night before surgery.  12. Place clean sheets on your bed the night of your first shower and do not sleep with pets.  13. Shower again with the CHG soap on the day of surgery prior to arriving at the hospital.  14. Do not apply any deodorants/lotions/powders.  15. Please wear clean clothes to the hospital.   [employer] to address health-related social needs:  [email or web address]

## 2024-01-25 ENCOUNTER — Encounter
Admission: RE | Admit: 2024-01-25 | Discharge: 2024-01-25 | Disposition: A | Discharge status: Home / Self Care | Source: Ambulatory Visit | Attending: Obstetrics and Gynecology | Admitting: Obstetrics and Gynecology

## 2024-01-25 DIAGNOSIS — C539 Malignant neoplasm of cervix uteri, unspecified: Secondary | ICD-10-CM | POA: Insufficient documentation

## 2024-01-25 DIAGNOSIS — Z01818 Encounter for other preprocedural examination: Secondary | ICD-10-CM | POA: Diagnosis present

## 2024-01-25 DIAGNOSIS — Z01812 Encounter for preprocedural laboratory examination: Secondary | ICD-10-CM | POA: Insufficient documentation

## 2024-01-27 ENCOUNTER — Encounter
Admission: RE | Disposition: A | Discharge status: Home / Self Care | Payer: Self-pay | Source: Home / Self Care | Attending: Obstetrics and Gynecology

## 2024-01-27 ENCOUNTER — Other Ambulatory Visit: Payer: Self-pay

## 2024-01-27 ENCOUNTER — Ambulatory Visit
Admission: RE | Admit: 2024-01-27 | Discharge: 2024-01-27 | Disposition: A | Discharge status: Home / Self Care | Attending: Obstetrics and Gynecology | Admitting: Obstetrics and Gynecology

## 2024-01-27 ENCOUNTER — Encounter: Admitting: Urgent Care

## 2024-01-27 ENCOUNTER — Ambulatory Visit

## 2024-01-27 ENCOUNTER — Encounter: Payer: Self-pay | Admitting: Obstetrics and Gynecology

## 2024-01-27 DIAGNOSIS — C530 Malignant neoplasm of endocervix: Secondary | ICD-10-CM | POA: Diagnosis present

## 2024-01-27 DIAGNOSIS — D649 Anemia, unspecified: Secondary | ICD-10-CM | POA: Diagnosis not present

## 2024-01-27 DIAGNOSIS — C539 Malignant neoplasm of cervix uteri, unspecified: Secondary | ICD-10-CM

## 2024-01-27 DIAGNOSIS — D251 Intramural leiomyoma of uterus: Secondary | ICD-10-CM

## 2024-01-27 DIAGNOSIS — G4730 Sleep apnea, unspecified: Secondary | ICD-10-CM | POA: Diagnosis not present

## 2024-01-27 DIAGNOSIS — R519 Headache, unspecified: Secondary | ICD-10-CM | POA: Diagnosis not present

## 2024-01-27 DIAGNOSIS — Z79899 Other long term (current) drug therapy: Secondary | ICD-10-CM | POA: Insufficient documentation

## 2024-01-27 DIAGNOSIS — Z87891 Personal history of nicotine dependence: Secondary | ICD-10-CM | POA: Insufficient documentation

## 2024-01-27 DIAGNOSIS — I7300 Raynaud's syndrome without gangrene: Secondary | ICD-10-CM | POA: Insufficient documentation

## 2024-01-27 DIAGNOSIS — Z30432 Encounter for removal of intrauterine contraceptive device: Secondary | ICD-10-CM | POA: Insufficient documentation

## 2024-01-27 DIAGNOSIS — N838 Other noninflammatory disorders of ovary, fallopian tube and broad ligament: Secondary | ICD-10-CM | POA: Diagnosis not present

## 2024-01-27 DIAGNOSIS — Z01818 Encounter for other preprocedural examination: Secondary | ICD-10-CM

## 2024-01-27 DIAGNOSIS — D252 Subserosal leiomyoma of uterus: Secondary | ICD-10-CM | POA: Insufficient documentation

## 2024-01-27 DIAGNOSIS — I10 Essential (primary) hypertension: Secondary | ICD-10-CM | POA: Insufficient documentation

## 2024-01-27 LAB — POCT PREGNANCY, URINE: Preg Test, Ur: NEGATIVE

## 2024-01-27 SURGERY — EXAM UNDER ANESTHESIA, PELVIC
Anesthesia: General

## 2024-01-27 MED ORDER — FENTANYL CITRATE (PF) 100 MCG/2ML IJ SOLN
INTRAMUSCULAR | Status: DC | PRN
  Administered 2024-01-27: 50 ug via INTRAVENOUS

## 2024-01-27 MED ORDER — HYDROMORPHONE HCL 1 MG/ML IJ SOLN
INTRAMUSCULAR | Status: AC
  Filled 2024-01-27: qty 1

## 2024-01-27 MED ORDER — HEPARIN SODIUM (PORCINE) 5000 UNIT/ML IJ SOLN
5000.0000 [IU] | INTRAMUSCULAR | Status: AC
  Administered 2024-01-27: 5000 [IU] via SUBCUTANEOUS

## 2024-01-27 MED ORDER — ONDANSETRON HCL 4 MG/2ML IJ SOLN
INTRAMUSCULAR | Status: DC | PRN
  Administered 2024-01-27: 4 mg via INTRAVENOUS

## 2024-01-27 MED ORDER — DEXAMETHASONE SODIUM PHOSPHATE 10 MG/ML IJ SOLN
INTRAMUSCULAR | Status: AC
  Filled 2024-01-27: qty 1

## 2024-01-27 MED ORDER — DEXAMETHASONE SODIUM PHOSPHATE 10 MG/ML IJ SOLN
INTRAMUSCULAR | Status: DC | PRN
  Administered 2024-01-27: 10 mg via INTRAVENOUS

## 2024-01-27 MED ORDER — PROPOFOL 1000 MG/100ML IV EMUL
INTRAVENOUS | Status: AC
  Filled 2024-01-27: qty 100

## 2024-01-27 MED ORDER — CEFAZOLIN SODIUM-DEXTROSE 2-4 GM/100ML-% IV SOLN
INTRAVENOUS | Status: AC
  Filled 2024-01-27: qty 100

## 2024-01-27 MED ORDER — CHLORHEXIDINE GLUCONATE 0.12 % MT SOLN
15.0000 mL | Freq: Once | OROMUCOSAL | Status: AC
  Administered 2024-01-27: 15 mL via OROMUCOSAL

## 2024-01-27 MED ORDER — ONDANSETRON HCL 4 MG/2ML IJ SOLN
4.0000 mg | Freq: Once | INTRAMUSCULAR | Status: AC
  Administered 2024-01-27: 4 mg via INTRAVENOUS

## 2024-01-27 MED ORDER — MIDAZOLAM HCL 2 MG/2ML IJ SOLN
INTRAMUSCULAR | Status: DC | PRN
  Administered 2024-01-27: 2 mg via INTRAVENOUS

## 2024-01-27 MED ORDER — BUPIVACAINE HCL (PF) 0.5 % IJ SOLN
INTRAMUSCULAR | Status: AC
  Filled 2024-01-27: qty 30

## 2024-01-27 MED ORDER — SUGAMMADEX SODIUM 200 MG/2ML IV SOLN
INTRAVENOUS | Status: DC | PRN
  Administered 2024-01-27: 200 mg via INTRAVENOUS

## 2024-01-27 MED ORDER — METRONIDAZOLE 500 MG/100ML IV SOLN
INTRAVENOUS | Status: DC | PRN
Start: 2024-01-27 — End: 2024-01-27
  Administered 2024-01-27: 500 mg via INTRAVENOUS

## 2024-01-27 MED ORDER — ROCURONIUM BROMIDE 10 MG/ML (PF) SYRINGE
PREFILLED_SYRINGE | INTRAVENOUS | Status: AC
  Filled 2024-01-27: qty 10

## 2024-01-27 MED ORDER — POVIDONE-IODINE 10 % EX SWAB
2.0000 | Freq: Once | CUTANEOUS | Status: AC
  Administered 2024-01-27: 2 via TOPICAL

## 2024-01-27 MED ORDER — PHENYLEPHRINE 80 MCG/ML (10ML) SYRINGE FOR IV PUSH (FOR BLOOD PRESSURE SUPPORT)
PREFILLED_SYRINGE | INTRAVENOUS | Status: DC | PRN
  Administered 2024-01-27 (×2): 160 ug via INTRAVENOUS

## 2024-01-27 MED ORDER — KETAMINE HCL 50 MG/5ML IJ SOSY
PREFILLED_SYRINGE | INTRAMUSCULAR | Status: DC | PRN
Start: 2024-01-27 — End: 2024-01-27
  Administered 2024-01-27: 30 mg via INTRAVENOUS

## 2024-01-27 MED ORDER — LACTATED RINGERS IV SOLN: INTRAVENOUS | Status: DC

## 2024-01-27 MED ORDER — LIDOCAINE HCL (PF) 2 % IJ SOLN
INTRAMUSCULAR | Status: AC
  Filled 2024-01-27: qty 5

## 2024-01-27 MED ORDER — HYDROMORPHONE HCL 1 MG/ML IJ SOLN
INTRAMUSCULAR | Status: AC
Start: 2024-01-27 — End: 2024-01-27
  Filled 2024-01-27: qty 1

## 2024-01-27 MED ORDER — HEMOSTATIC AGENTS (NO CHARGE) OPTIME
TOPICAL | Status: DC | PRN
  Administered 2024-01-27: 1 via TOPICAL

## 2024-01-27 MED ORDER — PROPOFOL 1000 MG/100ML IV EMUL
INTRAVENOUS | Status: AC
Start: 2024-01-27 — End: 2024-01-27
  Filled 2024-01-27: qty 100

## 2024-01-27 MED ORDER — OXYCODONE-ACETAMINOPHEN 5-325 MG PO TABS: 1.0000 | ORAL_TABLET | Freq: Once | ORAL | Status: DC

## 2024-01-27 MED ORDER — BUPIVACAINE HCL (PF) 0.5 % IJ SOLN
INTRAMUSCULAR | Status: DC | PRN
  Administered 2024-01-27: 3 mL
  Administered 2024-01-27: 13 mL

## 2024-01-27 MED ORDER — PROPOFOL 10 MG/ML IV BOLUS
INTRAVENOUS | Status: AC
  Filled 2024-01-27: qty 20

## 2024-01-27 MED ORDER — PHENYLEPHRINE 80 MCG/ML (10ML) SYRINGE FOR IV PUSH (FOR BLOOD PRESSURE SUPPORT)
PREFILLED_SYRINGE | INTRAVENOUS | Status: AC
  Filled 2024-01-27: qty 10

## 2024-01-27 MED ORDER — OXYCODONE HCL 5 MG PO TABS
ORAL_TABLET | ORAL | Status: AC
Start: 2024-01-27 — End: 2024-01-27
  Filled 2024-01-27: qty 1

## 2024-01-27 MED ORDER — IBUPROFEN 600 MG PO TABS: 600.0000 mg | ORAL_TABLET | Freq: Four times a day (QID) | ORAL | 0 refills | Status: AC

## 2024-01-27 MED ORDER — FENTANYL CITRATE (PF) 100 MCG/2ML IJ SOLN
INTRAMUSCULAR | Status: AC
  Filled 2024-01-27: qty 2

## 2024-01-27 MED ORDER — OXYCODONE-ACETAMINOPHEN 5-325 MG PO TABS
2.0000 | ORAL_TABLET | Freq: Once | ORAL | Status: AC
  Administered 2024-01-27: 2 via ORAL

## 2024-01-27 MED ORDER — PROPOFOL 10 MG/ML IV BOLUS
INTRAVENOUS | Status: DC | PRN
Start: 2024-01-27 — End: 2024-01-27
  Administered 2024-01-27: 100 ug/kg/min via INTRAVENOUS
  Administered 2024-01-27: 150 mg via INTRAVENOUS

## 2024-01-27 MED ORDER — INDOCYANINE GREEN 25 MG IV SOLR
INTRAVENOUS | Status: DC | PRN
  Administered 2024-01-27: 25 mg via TOPICAL

## 2024-01-27 MED ORDER — ONDANSETRON HCL 4 MG/2ML IJ SOLN
INTRAMUSCULAR | Status: AC
  Filled 2024-01-27: qty 2

## 2024-01-27 MED ORDER — ONDANSETRON 4 MG PO TBDP: 4.0000 mg | ORAL_TABLET | Freq: Four times a day (QID) | ORAL | 0 refills | Status: AC | PRN

## 2024-01-27 MED ORDER — OXYCODONE-ACETAMINOPHEN 5-325 MG PO TABS: 1.0000 | ORAL_TABLET | Freq: Four times a day (QID) | ORAL | 0 refills | Status: AC | PRN

## 2024-01-27 MED ORDER — LABETALOL HCL 5 MG/ML IV SOLN
INTRAVENOUS | Status: DC | PRN
Start: 2024-01-27 — End: 2024-01-27
  Administered 2024-01-27: 5 mg via INTRAVENOUS

## 2024-01-27 MED ORDER — GLYCOPYRROLATE 0.2 MG/ML IJ SOLN
INTRAMUSCULAR | Status: DC | PRN
  Administered 2024-01-27: .2 mg via INTRAVENOUS

## 2024-01-27 MED ORDER — ROCURONIUM BROMIDE 100 MG/10ML IV SOLN
INTRAVENOUS | Status: DC | PRN
  Administered 2024-01-27: 50 mg via INTRAVENOUS
  Administered 2024-01-27 (×3): 20 mg via INTRAVENOUS
  Administered 2024-01-27: 30 mg via INTRAVENOUS

## 2024-01-27 MED ORDER — HYDROMORPHONE HCL 1 MG/ML IJ SOLN
INTRAMUSCULAR | Status: DC | PRN
  Administered 2024-01-27: 1 mg via INTRAVENOUS

## 2024-01-27 MED ORDER — OXYCODONE HCL 5 MG/5ML PO SOLN: 5.0000 mg | Freq: Once | ORAL | Status: AC | PRN

## 2024-01-27 MED ORDER — OXYCODONE HCL 5 MG PO TABS
5.0000 mg | ORAL_TABLET | Freq: Once | ORAL | Status: AC | PRN
  Administered 2024-01-27: 5 mg via ORAL

## 2024-01-27 MED ORDER — 0.9 % SODIUM CHLORIDE (POUR BTL) OPTIME
TOPICAL | Status: DC | PRN
  Administered 2024-01-27: 500 mL

## 2024-01-27 MED ORDER — METRONIDAZOLE 500 MG/100ML IV SOLN
500.0000 mg | Freq: Once | INTRAVENOUS | Status: DC
  Filled 2024-01-27: qty 100

## 2024-01-27 MED ORDER — LIDOCAINE HCL (CARDIAC) PF 100 MG/5ML IV SOSY
PREFILLED_SYRINGE | INTRAVENOUS | Status: DC | PRN
  Administered 2024-01-27: 100 mg via INTRAVENOUS

## 2024-01-27 MED ORDER — HEPARIN SODIUM (PORCINE) 5000 UNIT/ML IJ SOLN
INTRAMUSCULAR | Status: AC
  Filled 2024-01-27: qty 1

## 2024-01-27 MED ORDER — DEXMEDETOMIDINE HCL IN NACL 80 MCG/20ML IV SOLN
INTRAVENOUS | Status: DC | PRN
Start: 2024-01-27 — End: 2024-01-27
  Administered 2024-01-27: 8 ug via INTRAVENOUS

## 2024-01-27 MED ORDER — CHLORHEXIDINE GLUCONATE 0.12 % MT SOLN
OROMUCOSAL | Status: AC
  Filled 2024-01-27: qty 15

## 2024-01-27 MED ORDER — OXYCODONE-ACETAMINOPHEN 5-325 MG PO TABS
ORAL_TABLET | ORAL | Status: AC
  Filled 2024-01-27: qty 2

## 2024-01-27 MED ORDER — ORAL CARE MOUTH RINSE: 15.0000 mL | Freq: Once | OROMUCOSAL | Status: AC

## 2024-01-27 MED ORDER — HYDROMORPHONE HCL 1 MG/ML IJ SOLN
0.2500 mg | INTRAMUSCULAR | Status: DC | PRN
  Administered 2024-01-27 (×2): 0.5 mg via INTRAVENOUS

## 2024-01-27 MED ORDER — KETAMINE HCL 50 MG/5ML IJ SOSY
PREFILLED_SYRINGE | INTRAMUSCULAR | Status: AC
  Filled 2024-01-27: qty 5

## 2024-01-27 MED ORDER — MIDAZOLAM HCL 2 MG/2ML IJ SOLN
INTRAMUSCULAR | Status: AC
  Filled 2024-01-27: qty 2

## 2024-01-27 MED ORDER — ALBUTEROL SULFATE HFA 108 (90 BASE) MCG/ACT IN AERS
INHALATION_SPRAY | RESPIRATORY_TRACT | Status: DC | PRN
  Administered 2024-01-27: 4 via RESPIRATORY_TRACT

## 2024-01-27 MED ORDER — CEFAZOLIN SODIUM-DEXTROSE 2-4 GM/100ML-% IV SOLN
2.0000 g | INTRAVENOUS | Status: AC
  Administered 2024-01-27: 2 g via INTRAVENOUS

## 2024-01-27 SURGICAL SUPPLY — 80 items
ANCHOR TIS RET SYS 235ML (MISCELLANEOUS) IMPLANT
BAG LAPAROSCOPIC 12 15 PORT 16 (BASKET) IMPLANT
BAG PRESSURE INF DISP 1000 (BAG) IMPLANT
BAG URINE DRAIN 2000ML AR STRL (UROLOGICAL SUPPLIES) ×2 IMPLANT
BLADE SURG SZ11 CARB STEEL (BLADE) ×2 IMPLANT
CANNULA CAP OBTURATR AIRSEAL 8 (CAP) ×2 IMPLANT
CATH URTH 16FR FL 2W BLN LF (CATHETERS) ×2 IMPLANT
CAUTERY HOOK MNPLR 1.6 DVNC XI (INSTRUMENTS) ×2 IMPLANT
CNTNR URN SCR LID CUP LEK RST (MISCELLANEOUS) IMPLANT
COUNTER NDL MAGNETIC 40 RED (SET/KITS/TRAYS/PACK) IMPLANT
COUNTER NEEDLE MAGNETIC 40 RED (SET/KITS/TRAYS/PACK) IMPLANT
COVER LIGHT HANDLE STERIS (MISCELLANEOUS) IMPLANT
COVER TIP SHEARS 8 DVNC (MISCELLANEOUS) ×2 IMPLANT
COVER WAND RF STERILE (DRAPES) ×2 IMPLANT
DERMABOND ADVANCED .7 DNX12 (GAUZE/BANDAGES/DRESSINGS) ×2 IMPLANT
DRAPE ARM DVNC X/XI (DISPOSABLE) ×6 IMPLANT
DRAPE COLUMN DVNC XI (DISPOSABLE) ×2 IMPLANT
DRAPE SHEET LG 3/4 BI-LAMINATE (DRAPES) ×2 IMPLANT
DRESSING SURGICEL FIBRLLR 1X2 (HEMOSTASIS) IMPLANT
DRIVER NDL MEGA SUTCUT DVNCXI (INSTRUMENTS) ×2 IMPLANT
DRIVER NDLE MEGA SUTCUT DVNCXI (INSTRUMENTS) ×2 IMPLANT
DRSG TELFA 3X4 N-ADH STERILE (GAUZE/BANDAGES/DRESSINGS) IMPLANT
ELECT BLADE 6 FLAT ULTRCLN (ELECTRODE) IMPLANT
ELECTRODE CAUTERY BLDE TIP 2.5 (TIP) ×2 IMPLANT
ELECTRODE REM PT RTRN 9FT ADLT (ELECTROSURGICAL) ×2 IMPLANT
FORCEPS BPLR 8 MD DVNC XI (FORCEP) ×2 IMPLANT
FORCEPS BPLR FENES DVNC XI (FORCEP) ×2 IMPLANT
GAUZE 4X4 16PLY ~~LOC~~+RFID DBL (SPONGE) ×2 IMPLANT
GLOVE BIO SURGEON STRL SZ 6.5 (GLOVE) ×12 IMPLANT
GLOVE BIO SURGEON STRL SZ7 (GLOVE) IMPLANT
GLOVE BIOGEL PI IND STRL 6.5 (GLOVE) ×8 IMPLANT
GLOVE BIOGEL PI IND STRL 7.0 (GLOVE) ×2 IMPLANT
GLOVE BIOGEL PI IND STRL 7.5 (GLOVE) IMPLANT
GOWN STRL REUS W/ TWL LRG LVL3 (GOWN DISPOSABLE) ×16 IMPLANT
IRRIGATION STRYKERFLOW (MISCELLANEOUS) IMPLANT
IV NS 1000ML BAXH (IV SOLUTION) IMPLANT
KIT IMAGING PINPOINTPAQ (MISCELLANEOUS) IMPLANT
KIT PINK PAD W/HEAD ARM REST (MISCELLANEOUS) ×2 IMPLANT
LABEL OR SOLS (LABEL) ×2 IMPLANT
MANIFOLD NEPTUNE II (INSTRUMENTS) ×2 IMPLANT
MANIPULATOR VCARE LG CRV RETR (MISCELLANEOUS) IMPLANT
MANIPULATOR VCARE SML CRV RETR (MISCELLANEOUS) IMPLANT
MANIPULATOR VCARE STD CRV RETR (MISCELLANEOUS) IMPLANT
NDL HYPO 22X1.5 SAFETY MO (MISCELLANEOUS) ×2 IMPLANT
NDL INSUFFLATION 14GA 120MM (NEEDLE) ×2 IMPLANT
NDL SPNL 22GX3.5 QUINCKE BK (NEEDLE) IMPLANT
NEEDLE HYPO 22X1.5 SAFETY MO (MISCELLANEOUS) ×2 IMPLANT
NEEDLE INSUFFLATION 14GA 120MM (NEEDLE) ×2 IMPLANT
NEEDLE SPNL 22GX3.5 QUINCKE BK (NEEDLE) IMPLANT
NS IRRIG 1000ML POUR BTL (IV SOLUTION) ×2 IMPLANT
OBTURATOR OPTICALSTD 8 DVNC (TROCAR) IMPLANT
OCCLUDER COLPOPNEUMO (BALLOONS) ×2 IMPLANT
PACK GYN LAPAROSCOPIC (MISCELLANEOUS) ×2 IMPLANT
PAD PREP OB/GYN DISP 24X41 (PERSONAL CARE ITEMS) ×2 IMPLANT
PENCIL SMOKE EVACUATOR (MISCELLANEOUS) ×2 IMPLANT
SCISSORS MNPLR CVD DVNC XI (INSTRUMENTS) ×2 IMPLANT
SCRUB CHG 4% DYNA-HEX 4OZ (MISCELLANEOUS) ×2 IMPLANT
SEAL UNIV 5-12 XI (MISCELLANEOUS) ×6 IMPLANT
SEALER VESSEL EXT DVNC XI (MISCELLANEOUS) IMPLANT
SET CYSTO W/LG BORE CLAMP LF (SET/KITS/TRAYS/PACK) IMPLANT
SET TUBE FILTERED XL AIRSEAL (SET/KITS/TRAYS/PACK) ×2 IMPLANT
SLEEVE Z-THREAD 5X100MM (TROCAR) IMPLANT
SOLUTION ELECTROSURG ANTI STCK (MISCELLANEOUS) ×2 IMPLANT
SPONGE T-LAP 18X18 ~~LOC~~+RFID (SPONGE) IMPLANT
SUT STRATAFIX SPIRAL PDS+ 0 30 (SUTURE) IMPLANT
SUT VIC AB 0 CT1 36 (SUTURE) ×2 IMPLANT
SUT VICRYL 0 UR6 27IN ABS (SUTURE) ×2 IMPLANT
SUTURE MNCRL 4-0 27XMF (SUTURE) ×2 IMPLANT
SUTURE STRAT PDS 2-0 23 CT-2.5 (SUTURE) IMPLANT
SUTURE STRATFX 0 PDS+ CT-2 23 (SUTURE) IMPLANT
SYR 10ML LL (SYRINGE) ×2 IMPLANT
SYR 3ML LL SCALE MARK (SYRINGE) IMPLANT
SYR 50ML LL SCALE MARK (SYRINGE) ×2 IMPLANT
SYR TOOMEY 50ML (SYRINGE) IMPLANT
TAPE TRANSPORE STRL 2 31045 (GAUZE/BANDAGES/DRESSINGS) ×2 IMPLANT
TRAP FLUID SMOKE EVACUATOR (MISCELLANEOUS) ×2 IMPLANT
TRAP SPECIMEN MUCUS 40CC (MISCELLANEOUS) IMPLANT
TROCAR Z-THREAD FIOS 11X100 BL (TROCAR) IMPLANT
TROCAR Z-THREAD FIOS 5X100MM (TROCAR) IMPLANT
WATER STERILE IRR 500ML POUR (IV SOLUTION) ×2 IMPLANT

## 2024-01-27 NOTE — Op Note (Signed)
 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 She does not desire future fertility.  She desires ovarian preservation.  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 She does not desire future fertility. She desires ovarian preservation.   SURGEON: Surgeon(s) and Role: Panel 1:  Jeaninne Lodico Isidor Constable, MD  ASSISTANT:  Garnette Mace, MD ANESTHESIA: General  PROCEDURE: Procedure(s): Exam under anesthesia, Robotic assisted total hysterectomy and bilateral salpingectomy with sentinel node injection, mapping, and biopsies.  ESTIMATED BLOOD LOSS: 50 cc  DRAINS: None  TOTAL IV FLUIDS: as per anesthesia  UOP: Foley as per anesthesia  SPECIMENS:  Uterus, cervix, and bilateral fallopian tubes, and bilateral obturator (mid on right; and proximal obturator/exteranl iliac vein on left)sentinel nodes  COMPLICATIONS: None  DISPOSITION: PACU  CONDITION: Stable  INDICATIONS: Cervical cancer  FINDINGS: Exam under anesthesia revealed an normal mobile anteverted uterus. There were no adnexal masses or nodularity. The parametria was smooth. The cervix was negative for gross lesions - healed CKC and shortened. Intraoperative findings included: The uterus was normal size and shape. The adnexa were normal bilaterally. The upper abdomen was normal including omentum, bowel, liver, stomach, and diaphragmatic surfaces. There was no evidence of grossly enlarged pelvic or right para-aortic lymph nodes. The sentinel nodes mapped as noted above.   PROCEDURE IN DETAIL: After informed consent was obtained, the patient was taken to the operating room where anesthesia was obtained without difficulty. The patient was positioned in the dorsal lithotomy position in [place] stirrups and her arms were carefully tucked at her sides and the usual  precautions were taken.  She was prepped and draped in normal sterile fashion.  Time-out was performed.  A speculum was placed in the vagina.  The sentinel lymph node injection was performed with ICG. A EEA sizer manipulator was then placed in the vagina without incident.  A uterine manipulator was not used.   Operative entry was obtained via a supraumbilical incision and direct entry. The abdomen insuffulated, and pelvis visualized with noted findings above.  The patient was placed in Trendelenburg and the bowel was displaced up into the upper abdomen.  The robotic ports and LUQ port placement, and robotic docking was performed. Round ligaments were divided on each side and the retroperitoneal space was opened bilaterally. The infundibulopelvic ligaments were skeletonized, and the ureters were identified and preserved.  The retroperitoneal spaces were opened and sentinel nodes identified as well as the iliac vessels and obturator nerves. The sentinel nodes were removed and placed in th paravesical spaces.   The bilateral salpingectomy was performed and the utero-ovarian ligaments were sealed and divided.  A bladder flap was created and the bladder was dissected down off the lower uterine segment and cervix. The uterus was manipulated to allow exposure of the uterine arteries. The uterine arteries were skeletonized bilaterally, sealed and divided with cautery and transected.  A colpotomy was performed circumferentially the cervix was incised from the vagina. Additional margins vaginal were obtained to ensure the cervix was completely removed as it was difficult to delineate after the cone. The EEA sizer and specimen was removed.  A pneumo balloon was placed in the vagina and ring forceps used to removed both sentinel lymph nodes. The vaginal cuff was then closed in a running continuous fashion using 0 V-Lock suture with careful attention to  include the vaginal cuff angles and the vaginal mucosa within the  closure.    Hemostasis was observed. The intraperitoneal pressure was dropped, and all planes of dissection, vascular pedicles and the vaginal cuff were found to be hemostatic.  The trocars were removed.  The skin incisions were closed with subcuticular stitch and/or skin glue.  The patient tolerated the procedure well.  Sponge, lap and needle counts were correct x2.  The patient was taken to recovery room in excellent condition.  Antibiotics: Given 1st or 2nd generation cephalosporin and Flagyl  Antibiotics given within 1 hour of the start of the procedure, Antibiotics ordered to be discontinued within 24 hours post procedure   VTE prophylaxis: was ordered perioperatively.   Dr. Garnette Mace assisted me with the procedure which could not have been performed without his assistance. This was a high-level case requiring a Retail buyer. Dr. Mace performed the initial abdominal entry, robotic port placement, docking, and insertion of robotic instruments while I performed the vaginal part of the procedure and SLN injection. He also provided high level assistance throughout the procedure that was needed for sentinel node mapping and biopsy, and EEA sizer manipulation. He performed over 50% of the hysterectomy.     ELBY WEBB LOGES, MD

## 2024-01-27 NOTE — Anesthesia Preprocedure Evaluation (Addendum)
 Anesthesia Evaluation  Patient identified by MRN, date of birth, ID band Patient awake    Reviewed: Allergy & Precautions, NPO status , Patient's Chart, lab work & pertinent test results  History of Anesthesia Complications Negative for: history of anesthetic complications  Airway Mallampati: III  TM Distance: >3 FB Neck ROM: full    Dental no notable dental hx.    Pulmonary sleep apnea , former smoker   Pulmonary exam normal        Cardiovascular hypertension, Normal cardiovascular exam     Neuro/Psych  Headaches  Neuromuscular disease  negative psych ROS   GI/Hepatic negative GI ROS, Neg liver ROS,,,  Endo/Other  negative endocrine ROS    Renal/GU      Musculoskeletal   Abdominal   Peds  Hematology  (+) Blood dyscrasia, anemia   Anesthesia Other Findings Past Medical History: No date: Adenocarcinoma in situ of endocervix No date: Anemia No date: Asymptomatic varicose veins of left lower extremity No date: Axillary hidradenitis suppurativa No date: Bilateral carpal tunnel syndrome     Comment:  a.) (+) associated paraesthesias to BILATERAL hands No date: Gestational hypertension     Comment:  has had issues with blood pressure since pregnancy off               and on No date: Joint pain No date: Migraines No date: Raynaud's disease No date: Sleep apnea  Past Surgical History: 12/11/2022: BREAST BIOPSY; Left     Comment:  US  LT BREAST BX W LOC DEV 1ST LESION IMG BX SPEC US                GUIDE 12/11/2022 [facility] 06/30/2018: CARPAL TUNNEL RELEASE; Right     Comment:  Procedure: CARPAL TUNNEL RELEASE ENDOSCOPIC;  Surgeon:               Edie Norleen PARAS, MD;  Location: [facility];                Service: Orthopedics;  Laterality: Right; 06/18/2021: CARPAL TUNNEL RELEASE; Left     Comment:  Procedure: CARPAL TUNNEL RELEASE ENDOSCOPIC;  Surgeon:               Edie Norleen PARAS, MD;  Location: [facility];  Service:               Orthopedics;  Laterality: Left; 12/09/2023: CERVICAL CONIZATION W/BX; N/A     Comment:  Procedure: CONE BIOPSY, CERVIX; ENDOCERVICAL CURETTINGS;              Surgeon: Leonce Garnette BIRCH, MD;  Location: [facility] ORS;                Service: Gynecology;  Laterality: N/A;     Reproductive/Obstetrics negative OB ROS                              Anesthesia Physical Anesthesia Plan  ASA: 3  Anesthesia Plan: General ETT   Post-op Pain Management: Ofirmev  IV (intra-op)*, Dilaudid  IV, Toradol  IV (intra-op)* and Ketamine  IV*   Induction: Intravenous  PONV Risk Score and Plan: 3 and Ondansetron , Dexamethasone , Midazolam  and Treatment may vary due to age or medical condition  Airway Management Planned: Oral ETT  Additional Equipment:   Intra-op Plan:   Post-operative Plan: Extubation in OR  Informed Consent: I have reviewed the patients History and Physical, chart, labs and discussed the procedure including the risks, benefits and alternatives  for the proposed anesthesia with the patient or authorized representative who has indicated his/her understanding and acceptance.     Dental Advisory Given  Plan Discussed with: Anesthesiologist, CRNA and Surgeon  Anesthesia Plan Comments: (Patient consented for risks of anesthesia including but not limited to:  - adverse reactions to medications - damage to eyes, teeth, lips or other oral mucosa - nerve damage due to positioning  - sore throat or hoarseness - Damage to heart, brain, nerves, lungs, other parts of body or loss of life  Patient voiced understanding and assent.)         Anesthesia Quick Evaluation

## 2024-01-27 NOTE — Anesthesia Procedure Notes (Signed)
 Procedure Name: Intubation Date/Time: 01/27/2024 4:31 PM  Performed by: Lorriane Arabia, CRNAPre-anesthesia Checklist: Patient identified, Patient being monitored, Timeout performed, Emergency Drugs available and Suction available Patient Re-evaluated:Patient Re-evaluated prior to induction Oxygen Delivery Method: Circle system utilized Preoxygenation: Pre-oxygenation with 100% oxygen Induction Type: IV induction Ventilation: Mask ventilation without difficulty Laryngoscope Size: 3 and McGrath Grade View: Grade I Tube type: Oral Tube size: 7.0 mm Number of attempts: 1 Airway Equipment and Method: Stylet Placement Confirmation: ETT inserted through vocal cords under direct vision, positive ETCO2 and breath sounds checked- equal and bilateral Secured at: 21 cm Tube secured with: Tape Dental Injury: Teeth and Oropharynx as per pre-operative assessment

## 2024-01-27 NOTE — Anesthesia Postprocedure Evaluation (Signed)
 Anesthesia Post Note  Patient: Theresa Davidson  Procedure(s) Performed: EXAM UNDER ANESTHESIA, PELVIC HYSTERECTOMY, TOTAL, ROBOT-ASSISTED, LAPAROSCOPIC, WITH BILATERAL SALPINGECTOMY (Bilateral) INJECTION, FOR SENTINEL LYMPH NODE IDENTIFICATION (Bilateral) LYMPH NODE BIOPSY (Bilateral)  Patient location during evaluation: PACU Anesthesia Type: General Level of consciousness: awake and alert Pain management: pain level controlled Vital Signs Assessment: post-procedure vital signs reviewed and stable Respiratory status: spontaneous breathing, nonlabored ventilation, respiratory function stable and patient connected to nasal cannula oxygen Cardiovascular status: blood pressure returned to baseline and stable Postop Assessment: no apparent nausea or vomiting Anesthetic complications: no   There were no known notable events for this encounter.   Last Vitals:  Vitals:   01/27/24 2030 01/27/24 2110  BP: (!) 120/58 107/65  Pulse: 95   Resp: (!) 23 18  Temp:  (!) 36.3 C  SpO2: 91%     Last Pain:  Vitals:   01/27/24 2110  TempSrc:   PainSc: 6                  [name]

## 2024-01-27 NOTE — Interval H&P Note (Signed)
 History and Physical Interval Note:  01/27/2024 3:23 PM  Theresa Davidson  has presented today for surgery, with the diagnosis of Cervical cancer.  The various methods of treatment have been discussed with the patient and family. After consideration of risks, benefits and other options for treatment, the patient has consented to  Procedure(s): EXAM UNDER ANESTHESIA, PELVIC (N/A) HYSTERECTOMY, TOTAL, ROBOT-ASSISTED, LAPAROSCOPIC, WITH BILATERAL SALPINGO-OOPHORECTOMY (Bilateral) INJECTION, FOR SENTINEL LYMPH NODE IDENTIFICATION (Bilateral) LYMPH NODE BIOPSY (Bilateral) as a surgical intervention.  The patient's history has been reviewed, patient examined, no change in status, stable for surgery.  I have reviewed the patient's chart and labs.  Questions were answered to the patient's satisfaction.    bHCG negative. CBC adequate for surgery.   Theresa Bove Isidor Constable, MD   Theresa Davidson

## 2024-01-27 NOTE — Transfer of Care (Signed)
 Immediate Anesthesia Transfer of Care Note  Patient: Theresa Davidson  Procedure(s) Performed: EXAM UNDER ANESTHESIA, PELVIC HYSTERECTOMY, TOTAL, ROBOT-ASSISTED, LAPAROSCOPIC, WITH BILATERAL SALPINGECTOMY (Bilateral) INJECTION, FOR SENTINEL LYMPH NODE IDENTIFICATION (Bilateral) LYMPH NODE BIOPSY (Bilateral)  Patient Location: PACU  Anesthesia Type:General  Level of Consciousness: awake and patient cooperative  Airway & Oxygen Therapy: Patient Spontanous Breathing  Post-op Assessment: Report given to RN and Post -op Vital signs reviewed and stable  Post vital signs: stable  Last Vitals:  Vitals Value Taken Time  BP 117/62 01/27/24 19:46  Temp    Pulse 99 01/27/24 19:49  Resp 15 01/27/24 19:49  SpO2 95 % 01/27/24 19:49  Vitals shown include unfiled device data.  Last Pain:  Vitals:   01/27/24 1026  TempSrc: Temporal  PainSc: 0-No pain         Complications: There were no known notable events for this encounter.

## 2024-01-28 ENCOUNTER — Encounter: Payer: Self-pay | Admitting: Obstetrics and Gynecology

## 2024-01-28 LAB — BPAM RBC
Blood Product Expiration Date: 202509072359
Blood Product Expiration Date: 202509072359
Unit Type and Rh: 5100
Unit Type and Rh: 5100

## 2024-01-28 LAB — TYPE AND SCREEN
ABO/RH(D): O POS
Antibody Screen: POSITIVE
Unit division: 0
Unit division: 0

## 2024-02-02 LAB — SURGICAL PATHOLOGY

## 2024-02-05 ENCOUNTER — Telehealth: Payer: Self-pay

## 2024-02-05 NOTE — Telephone Encounter (Signed)
 At the request of Dr. Mancil, [facility] sent request to send case [phone] to [place] for review.

## 2024-02-10 ENCOUNTER — Ambulatory Visit

## 2024-02-10 ENCOUNTER — Telehealth: Payer: Self-pay | Admitting: *Deleted

## 2024-02-10 ENCOUNTER — Other Ambulatory Visit: Payer: Self-pay

## 2024-02-10 DIAGNOSIS — C539 Malignant neoplasm of cervix uteri, unspecified: Secondary | ICD-10-CM

## 2024-02-10 NOTE — Telephone Encounter (Signed)
 [place] from claims that said which wants to know the fax number for this patient and the patient's name is Theresa Davidson with a date of birth Feb 15, 1981.   ([phone]).  Talked to another person there and she says that she would put it down and give it to [place] so that they can get the claims done and it is [phone] and you could go to directly to Joen Cleverly so that will get to the right place

## 2024-02-11 ENCOUNTER — Telehealth: Payer: Self-pay

## 2024-02-11 NOTE — Telephone Encounter (Signed)
 Called and left voicemail with Noreen to return call to ensure she has received PET scan date/time/instructions. Authorization is pending.

## 2024-02-12 ENCOUNTER — Telehealth: Payer: Self-pay

## 2024-02-12 NOTE — Telephone Encounter (Signed)
 Authorization for PET in MD review with insurance.

## 2024-02-15 ENCOUNTER — Telehealth: Payer: Self-pay | Admitting: *Deleted

## 2024-02-15 ENCOUNTER — Telehealth: Payer: Self-pay

## 2024-02-15 ENCOUNTER — Encounter: Payer: Self-pay | Admitting: Radiology

## 2024-02-15 ENCOUNTER — Ambulatory Visit
Admission: RE | Admit: 2024-02-15 | Discharge: 2024-02-15 | Disposition: A | Discharge status: Home / Self Care | Source: Ambulatory Visit | Attending: Obstetrics and Gynecology | Admitting: Obstetrics and Gynecology

## 2024-02-15 DIAGNOSIS — C539 Malignant neoplasm of cervix uteri, unspecified: Secondary | ICD-10-CM | POA: Insufficient documentation

## 2024-02-15 DIAGNOSIS — Z90710 Acquired absence of both cervix and uterus: Secondary | ICD-10-CM | POA: Insufficient documentation

## 2024-02-15 DIAGNOSIS — R9389 Abnormal findings on diagnostic imaging of other specified body structures: Secondary | ICD-10-CM | POA: Diagnosis not present

## 2024-02-15 LAB — GLUCOSE, CAPILLARY: Glucose-Capillary: 122 mg/dL — ABNORMAL HIGH (ref 70–99)

## 2024-02-15 MED ORDER — FLUDEOXYGLUCOSE F - 18 (FDG) INJECTION
11.5400 | Freq: Once | INTRAVENOUS | Status: AC | PRN
  Administered 2024-02-15: 11.54 via INTRAVENOUS

## 2024-02-15 NOTE — Telephone Encounter (Signed)
 Called radiology for stat read of PET prior to appointment on Wednesday 8/27.

## 2024-02-15 NOTE — Telephone Encounter (Signed)
 Short term disability request received from [place].  Discussed with Lauren A, NP and patient will be given continuous out of work from 01/28/24 to 04/20/2024.

## 2024-02-15 NOTE — Telephone Encounter (Signed)
 Competed paperwork faxed to [employer]

## 2024-02-17 ENCOUNTER — Ambulatory Visit: Admission: RE | Admit: 2024-02-17 | Source: Ambulatory Visit | Admitting: Radiation Oncology

## 2024-02-17 ENCOUNTER — Inpatient Hospital Stay: Attending: Obstetrics and Gynecology | Admitting: Obstetrics and Gynecology

## 2024-02-17 VITALS — BP 127/65 | HR 82 | Temp 97.6°F | Resp 20 | Wt 216.0 lb

## 2024-02-17 DIAGNOSIS — C539 Malignant neoplasm of cervix uteri, unspecified: Secondary | ICD-10-CM | POA: Diagnosis present

## 2024-02-17 NOTE — Progress Notes (Signed)
 Gynecologic Oncology Interval Visit  RMD: Dr. Garnette Mace  Chief complaints: stage IA2 cervical adenocarcinoma.  History of Present Illness: Theresa Davidson is a 43 y.o. G1P1 female who presents for management of stage IA2 cervical adenocarcinoma.   Based on stage IA2 disease s/p cone biopsy with negative margins she underwent RA TLH/BSO and SLN biopsies on 01/27/24.  She does not desire future fertility.   Slides were reviewed at [facility] for second opinion yesterday.  Dr. Alejos does not think there is residual adenocarcinoma or AIS, but rather inflamed endocervical glands.  He plans to do p16 IHC on the key blocks to confirm this conclusion.   FINAL DIAGNOSIS      1. Lymph node, sentinel, biopsy, left :      - ONE LYMPH NODE NEGATIVE FOR MALIGNANCY (0/1).       2. Lymph node, sentinel, biopsy, right :      - TWO LYMPH NODES NEGATIVE FOR MALIGNANCY (0/2).       3. Vagina, biopsy, posterior cuff :      - CAUTERIZED ECTO/ENDOCERVICAL JUNCTIONAL TISSUE WITH AREAS OF HIGH-GRADE      DYSPLASIA (HSIL), FOCALLY EXTENDING TO CAUTERIZED ENDOCERVICAL EDGE OF SPECIMEN.      - CHANGES CONSISTENT WITH PRIOR SURGICAL PROCEDURE.       4. Cervix, biopsy, left anterior :      - SQUAMOUS ECTOCERVICAL TISSUE WITH LOW-GRADE SQUAMOUS INTRAEPITHELIAL LESION      (LSIL/CIN-1), FOCALLY EXTENDING TO CAUTERIZED EDGES.      - CHANGES CONSISTENT WITH PRIOR SURGICAL PROCEDURE.       5. Cervix, biopsy, left 9 to 6 :      - SQUAMOUS ECTOCERVICAL TISSUE WITH LOW-GRADE SQUAMOUS INTRAEPITHELIAL LESION      (LSIL/CIN-1), FOCALLY EXTENDING TO CAUTERIZED EDGES.      - CHANGES CONSISTENT WITH PRIOR SURGICAL PROCEDURE.       6. Uterus, cervix and bilateral fallopian tubes,  :      - RESIDUAL INVASIVE ENDOCERVICAL ADENOCARCINOMA.      - ENDOCERVICAL ADENOCARCINOMA IN SITU (AIS).      - HIGH-GRADE SQUAMOUS INTRAEPITHELIAL LESION (HSIL/CIN-3).      - SEE CANCER SUMMARY BELOW.      - CHANGES CONSISTENT WITH PRIOR SURGICAL  PROCEDURE.      - WEAKLY PROLIFERATIVE ENDOMETRIUM.      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      - BILATERAL FALLOPIAN TUBES WITH FIMBRIATED END AND PARATUBAL CYSTS.       Diagnosis Note : In the hysterectomy specimen (specimen 6) residual invasive      endocervical adenocarcinoma is present with the largest linear focus measuring      3.8 mm.In one tangential section of the cervix (limiting measurement of depth of      invasion) adenocarcnoma is present 0.3 mm from the radial / circumferential      margin in the 6:00-9:00 quadrant location.In a well oriented section of the      cervix the deepest depth of invasion measures 7.8 mm where the wall thickness      measures 11.4 mm.BCL2 and p16 stains were performed to confirm the presence of      adenocarcinoma (p16 positive BCL2 negative).Stain controls worked appropriately.      A subset of slides on specimen 6 underwent intradepartmental consultation and      Dr. Macie and Dr. Delaine concur with the interpretation.   Slides were reviewed at [facility] for second opinion yesterday.  Dr. Alejos does not think there is residual adenocarcinoma or AIS, but rather inflamed endocervical glands.  He plans to do p16 IHC on the key blocks to confirm this conclusion.   PET scan done earlier this week and shows activity by the vaginal cuff and activity in a nodule in the lower abdomen.    Gynecologic Oncology History  Theresa Davidson is a pleasant G1P1 female who has cervical adenocarcinoma. She intially presented with cervical adenocarcinoma in situ.   09/16/23 seen for annual Gyn exam.  No bleeding or discharge.     Pap History:  2016- NILM, hpv not performed 09/08/2017- NILM, hpv not performed 12/10/2020- NILM, HPV Positive (12 mo f/u rec) 09/16/23- Endocervical adenocarcinoma in situ, HR HPV Positive. Genotype not performed   10/19/23- Cervix was reddened, lobular, irregular surface exophytic lesion w/o regular borders emanating from  edge of cervix. Blue outline is more regular shaped lesion similar in appearance. Colposcopy performed with aceto-white lesions at 10-11, 8, 5:00. Biopsies performed. ECC performed.    Cervical Biopsy, 2:00: ENDOCERVICAL ADENOCARCINOMA-IN-SITU.  Cervical Biopsy, 5:00: LOW-GRADE SQUAMOUS INTRAEPITHELIAL LESION (CIN 1).  Cervical Biopsy, 8:00: ENDOCERVICAL ADENOCARCINOMA-IN-SITU.  Cervical Biopsy,10:00: ENDOCERVICAL ADENOCARCINOMA-IN-SITU.  Endocervical Curettings: ENDOCERVICAL ADENOCARCINOMA-IN-SITU.    PAP 2022 negative, HPV HR+   PAP 3/19 normal    IUD for contraception  Cold Knife Conization was recommended and done 12/09/23. FINAL DIAGNOSIS       1. Cervix, cone, Cone biopsy stitch at 12 o'clock :      - ENDOCERVICAL ADENOCARCINOMA INVOLVING 9:00-12:00 AND 12:00-3:00 QUADRANTS.      - DEPTH OF INVASION: 3.0 MM.      - ENDOCERVICAL ADENOCARCINOMA IN SITU INVOLVING 6:00-9:00, 9:00-12:00, AND      12:00-3:00 QUADRANTS.      - HIGH-GRADE SQUAMOUS INTRAEPITHELIAL LESION (HSIL/CIN-3) INVOLVING 6:00-9:00      QUADRANT.      - ALL MARGINS ARE NEGATIVE FOR DYSPLASIA AND MALIGNANCY.      - SEE NOTE.       2. Endocervix, curettage,  :      - ENDOCERVIX WITH THERMAL ARTIFACT.      - DYSPLASIA AND MALIGNANCY ARE NOT IDENTIFIED.       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    [facility] review below. Original [facility] review had only shown 3 mm invasion instead of 4 mm, so now stage IA2.    Outside pathology [facility] review A.  Outside case, [phone], [facility], [place], [place].  Date of procedure 12/09/23:    1.  Cervix, cold knife cone excision:   Invasive endocervical adenocarcinoma, HPV associated, with associated endocervical adenocarcinoma in situ and focal high grade squamous intraepithelial lesion  (HSIL)/cervical intraepithelial neoplasia-2 (CIN-2). Depth of invasion: 4 mm. Horizontal size: 10 mm. Lymphatic/vascular invasion: Absent. Silva pattern B.  All margins of resection are negative for invasive and in situ carcinoma.   2.  Endocervix, curettage:   Fragments of endocervix. Negative for dysplasia or carcinoma.   Comment: Per the outside report, a p16 immunohistochemical stain on a prior cervical biopsy was positive (slide not submitted for review)    Past Medical History:  Diagnosis Date   Adenocarcinoma in situ of endocervix    Anemia    Asymptomatic varicose veins of left lower extremity    Axillary hidradenitis suppurativa    Bilateral carpal tunnel syndrome  a.) (+) associated paraesthesias to BILATERAL hands   Gestational hypertension    has had issues with blood pressure since pregnancy off and on   Joint pain    Migraines    Raynaud's disease    Sleep apnea    Past Surgical History:  Procedure Laterality Date   BREAST BIOPSY Left 12/11/2022   US  LT BREAST BX W LOC DEV 1ST LESION IMG BX SPEC US  GUIDE 12/11/2022 [facility]   CARPAL TUNNEL RELEASE Right 06/30/2018   Procedure: CARPAL TUNNEL RELEASE ENDOSCOPIC;  Surgeon: Edie Norleen PARAS, MD;  Location: [facility];  Service: Orthopedics;  Laterality: Right;   CARPAL TUNNEL RELEASE Left 06/18/2021   Procedure: CARPAL TUNNEL RELEASE ENDOSCOPIC;  Surgeon: Edie Norleen PARAS, MD;  Location: [facility];  Service: Orthopedics;  Laterality: Left;   CERVICAL CONIZATION W/BX N/A 12/09/2023   Procedure: CONE BIOPSY, CERVIX; ENDOCERVICAL CURETTINGS;  Surgeon: Leonce Garnette BIRCH, MD;  Location: [facility];  Service: Gynecology;  Laterality: N/A;   EXAM UNDER ANESTHESIA, PELVIC N/A 01/27/2024   Procedure: EXAM UNDER ANESTHESIA, PELVIC;  Surgeon: Elby Webb Loges, MD;  Location: [facility];  Service: Gynecology;  Laterality: N/A;   INJECTION, FOR SENTINEL LYMPH NODE IDENTIFICATION Bilateral 01/27/2024   Procedure:  INJECTION, FOR SENTINEL LYMPH NODE IDENTIFICATION;  Surgeon: Elby Webb Loges, MD;  Location: [facility];  Service: Gynecology;  Laterality: Bilateral;   LYMPH NODE BIOPSY Bilateral 01/27/2024   Procedure: LYMPH NODE BIOPSY;  Surgeon: Elby Webb Loges, MD;  Location: [facility];  Service: Gynecology;  Laterality: Bilateral;   ROBOTIC ASSISTED TOTAL HYSTERECTOMY WITH BILATERAL SALPINGO OOPHERECTOMY Bilateral 01/27/2024   Procedure: HYSTERECTOMY, TOTAL, ROBOT-ASSISTED, LAPAROSCOPIC, WITH BILATERAL SALPINGECTOMY;  Surgeon: Elby Webb Loges, MD;  Location: [facility];  Service: Gynecology;  Laterality: Bilateral;   OB History  Gravida Para Term Preterm AB Living  1 1 1   1   SAB IAB Ectopic Multiple Live Births     0 1    # Outcome Date GA Lbr Len/2nd Weight Sex Type Anes PTL Lv  1 Term 01/26/18 [redacted]w[redacted]d / 02:42 6 lb 9.8 oz (3 kg) M Vag-Spont EPI  LIV  Patient denies any other pertinent gynecologic issues.   Current Outpatient Medications on File Prior to Visit  Medication Sig Dispense Refill   ibuprofen  (ADVIL ) 600 MG tablet Take 1 tablet (600 mg total) by mouth every 6 (six) hours. 30 tablet 0   ondansetron  (ZOFRAN -ODT) 4 MG disintegrating tablet Take 1 tablet (4 mg total) by mouth every 6 (six) hours as needed for nausea. 20 tablet 0   oxyCODONE -acetaminophen  (PERCOCET) 5-325 MG tablet Take 1 tablet by mouth every 6 (six) hours as needed (breakthrough). 20 tablet 0   zonisamide (ZONEGRAN) 100 MG capsule Take 100 mg by mouth daily.     No current facility-administered medications on file prior to visit.   Allergies  Allergen Reactions   Penicillins Other (See Comments)    Doesn't know symptoms last had it as a child  As a child    Social History:   reports that she has quit smoking. Her smoking use included cigarettes. She has never used smokeless tobacco. She reports that she does not currently use alcohol. She reports that she does not use drugs.  Family History  Problem  Relation Age of Onset   Breast cancer Neg Hx     Review of Systems:  General: no complaints  HEENT: no complaints  Lungs: no complaints  Cardiac: no complaints  GI: decreased appetite o/w  no complaints  GU: no complaints GYN: vaginal bleeding due to menses  Musculoskeletal: no complaints  Extremities: no complaints  Skin: no complaints  Neuro: no complaints  Endocrine: no complaints  Psych: no complaints       PHYSICAL EXAM: Blood pressure 127/65, pulse 82, temperature 97.6 F (36.4 C), resp. rate 20, weight 216 lb (98 kg), last menstrual period 01/19/2024, SpO2 100%.   ECOG - 0   GENERAL: Patient is a well appearing female in no acute distress HEENT:  PERRL, neck supple with midline trachea. Thyroid without masses.  NODES:  No cervical, supraclavicular, axillary, or inguinal lymphadenopathy palpated.  LUNGS:  Clear to auscultation bilaterally.   HEART:  Regular rate and rhythm.  ABDOMEN:  Soft, nontender. Nondistended. No ascites or masses.  EXTREMITIES:  No peripheral edema.   SKIN:  Clear with no obvious rashes or skin changes.  NEURO:  Nonfocal. Well oriented.  Appropriate affect.  Pelvic: Chaperoned by CMA EGBUS: no lesions Cervix: absent Vagina: no lesions, cuff healing well, no discharge or bleeding Uterus: absent Adnexa: no palpable masses Bimanual: normal post op induration at cuff.     Labs Lab Results  Component Value Date   WBC 7.2 12/09/2023   HGB 14.3 12/09/2023   HCT 41.9 12/09/2023   MCV 85.9 12/09/2023   PLT 242 12/09/2023   09/16/2023 Component Ref Range & Units 3 mo ago  Glucose 70 - 110 mg/dL 90  Sodium 863 - 854 mmol/L 138  Potassium 3.6 - 5.1 mmol/L 3.9  Chloride 97 - 109 mmol/L 103  Carbon Dioxide (CO2) 22.0 - 32.0 mmol/L 27.4  Urea Nitrogen (BUN) 7 - 25 mg/dL 18  Creatinine 0.6 - 1.1 mg/dL 0.8  Glomerular Filtration Rate (eGFR) >60 mL/min/1.73sq m 94   Calcium 8.7 - 10.3 mg/dL 9.4  AST 8 - 39 U/L 16  ALT 5 - 38  U/L 23  Alk Phos (alkaline Phosphatase) 34 - 104 U/L 78  Albumin 3.5 - 4.8 g/dL 4.6  Bilirubin, Total 0.3 - 1.2 mg/dL 0.6  Protein, Total 6.1 - 7.9 g/dL 7.2  A/G Ratio 1.0 - 5.0 gm/dL 1.8    Imaging PET scan 02/15/24  NECK:   Left maxillary periodontal inflammatory/infectious uptake. No abnormal hypermetabolism.   Incidental CT findings:   None.   CHEST:   No abnormal hypermetabolism.   Incidental CT findings:   Heart is enlarged.  No pericardial or pleural effusion.   ABDOMEN/PELVIS:   Asymmetric ill-defined soft tissue and stranding in the region of the left vaginal cuff (6/133), SUV max 22.9, status post recent hysterectomy. Focal hypermetabolism associated with a mesenteric nodule or possible serosal implant in the posterior central anatomic pelvis, measuring approximately 2.3 cm (6/120), SUV max 16.3. Tiny periaortic lymph nodes do not show metabolism above blood pool but are visualized.   Incidental CT findings:   Small low-attenuation lesion in the inferior right hepatic lobe, too small to characterize.   SKELETON:   No abnormal hypermetabolism.   Incidental CT findings:   Minimal degenerative change in the spine.   IMPRESSION: 1. Recent hysterectomy. Associated asymmetric hypermetabolism along the vaginal cuff is presumably postoperative in etiology. This examination will serve as baseline for future comparison. 2. Focal hypermetabolism in the small bowel mesentery or along the small bowel in the posterior central anatomic pelvis, worrisome for a metastasis. Consider CT or MR enterography in further evaluation, as clinically indicated. 3. Tiny retroperitoneal periaortic lymph nodes are not hypermetabolic but are visualized. Recommend attention on  follow-up.  Assessment and plan:   Theresa Davidson is a 43 y.o. 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 Cone biopsy with IUD removal 12/09/23.  Stage 1A2 Adenocarcinoma of the cervix with 4 mm invasion, no LVSI and negative margins.  Silva pattern B. She does not desire future fertility. She underwent RA TLH/BS and SLN biopsies on 01/27/24.  Ovaries preserved.  [facility] pathology showed negative SLNs, but persistent adenocarcinoma in the cervix with 7 mm invasion and disease within 0.3 mm of cervical margin.  Slides were reviewed at [facility] for second opinion.  Dr. Alejos does not think there is residual adenocarcinoma or AIS, but rather inflamed endocervical glands.  He plans to do p16 IHC on the key blocks to confirm this conclusion and then will issue a formal report.    PET scan was ordered prior to Dr Matt review and showed some activity in the vaginal cuff that is likely post op changes.  There was also a 2.3 cm PET positive nodule in the right lower abdomen.  I discussed with Dr Jeremy in Radiology and she suggested a CT scan with oral and IV contrast to better delineate this finding, which I suspect represents activity in her residual right ovary. Dr Jeremy suggested a CT scan with oral and IV contrast to better characterize this area.      Will contact the patient with the final pathology report from [facility] and CT scan results are available.     Prentice Agent, MD   Cc: Garnette Mace, MD 02/17/2024 4:29 PM

## 2024-02-18 ENCOUNTER — Encounter: Payer: Self-pay | Admitting: Nurse Practitioner

## 2024-02-25 ENCOUNTER — Telehealth: Payer: Self-pay | Admitting: Radiation Oncology

## 2024-02-25 NOTE — Telephone Encounter (Signed)
 Called pt to verify CT appt for 9/5 - pt confirmed - LH

## 2024-02-26 ENCOUNTER — Ambulatory Visit
Admission: RE | Admit: 2024-02-26 | Discharge: 2024-02-26 | Disposition: A | Discharge status: Home / Self Care | Source: Ambulatory Visit | Attending: Nurse Practitioner | Admitting: Nurse Practitioner

## 2024-02-26 DIAGNOSIS — C539 Malignant neoplasm of cervix uteri, unspecified: Secondary | ICD-10-CM | POA: Diagnosis present

## 2024-02-26 MED ORDER — IOHEXOL 9 MG/ML PO SOLN
500.0000 mL | ORAL | Status: AC
  Administered 2024-02-26: 500 mL via ORAL

## 2024-02-26 MED ORDER — IOHEXOL 300 MG/ML  SOLN
100.0000 mL | Freq: Once | INTRAMUSCULAR | Status: AC | PRN
  Administered 2024-02-26: 100 mL via INTRAVENOUS

## 2024-03-02 ENCOUNTER — Inpatient Hospital Stay: Attending: Obstetrics and Gynecology | Admitting: Nurse Practitioner

## 2024-03-02 VITALS — BP 126/81 | HR 96 | Temp 98.3°F | Resp 17 | Wt 220.2 lb

## 2024-03-02 DIAGNOSIS — C530 Malignant neoplasm of endocervix: Secondary | ICD-10-CM | POA: Diagnosis present

## 2024-03-02 DIAGNOSIS — B977 Papillomavirus as the cause of diseases classified elsewhere: Secondary | ICD-10-CM | POA: Insufficient documentation

## 2024-03-02 DIAGNOSIS — N939 Abnormal uterine and vaginal bleeding, unspecified: Secondary | ICD-10-CM | POA: Diagnosis not present

## 2024-03-02 DIAGNOSIS — Z90710 Acquired absence of both cervix and uterus: Secondary | ICD-10-CM | POA: Insufficient documentation

## 2024-03-02 DIAGNOSIS — Z9079 Acquired absence of other genital organ(s): Secondary | ICD-10-CM | POA: Insufficient documentation

## 2024-03-02 NOTE — Progress Notes (Signed)
 Gynecologic Oncology Interval Visit  Referring MD: Dr. Garnette Mace  Chief complaints: stage IA2 cervical adenocarcinoma  History of Present Illness: Theresa Davidson is a 43 y.o. G1P1 female who returns to clinic for discussion of pathology and imaging. She was diagnosed with stage IA2 cervical adenocarcinoma with negative margins s/p RA TLH-BSO with SLN biopsies on 01/27/24 with Dr Elby at [facility].   Dr Mancil had requested additional stains & pathology review with Dr Alejos which is still pending. Patient reports some vaginal spotting and requests pelvic exam today. Previously had post op exam with Dr Mancil on 02/17/24.   02/26/24- CT Abdomen Pelvis W Contrast performed but report is pending.    Gynecologic Oncology History  Theresa Davidson is a pleasant G1P1 female who has cervical adenocarcinoma. She intially presented with cervical adenocarcinoma in situ.   09/16/23 seen for annual Gyn exam.  No bleeding or discharge.     Pap History:  2016- NILM, hpv not performed 09/08/2017- NILM, hpv not performed 12/10/2020- NILM, HPV Positive (12 mo f/u rec) 09/16/23- Endocervical adenocarcinoma in situ, HR HPV Positive. Genotype not performed   10/19/23- Cervix was reddened, lobular, irregular surface exophytic lesion w/o regular borders emanating from edge of cervix. Blue outline is more regular shaped lesion similar in appearance. Colposcopy performed with aceto-white lesions at 10-11, 8, 5:00. Biopsies performed. ECC performed.    Cervical Biopsy, 2:00: ENDOCERVICAL ADENOCARCINOMA-IN-SITU.  Cervical Biopsy, 5:00: LOW-GRADE SQUAMOUS INTRAEPITHELIAL LESION (CIN 1).  Cervical Biopsy, 8:00: ENDOCERVICAL ADENOCARCINOMA-IN-SITU.  Cervical Biopsy,10:00: ENDOCERVICAL ADENOCARCINOMA-IN-SITU.  Endocervical Curettings: ENDOCERVICAL ADENOCARCINOMA-IN-SITU.    PAP 2022 negative, HPV HR+   PAP 3/19 normal    IUD for contraception  Cold Knife Conization was recommended and done 12/09/23. FINAL  DIAGNOSIS       1. Cervix, cone, Cone biopsy stitch at 12 o'clock :      - ENDOCERVICAL ADENOCARCINOMA INVOLVING 9:00-12:00 AND 12:00-3:00 QUADRANTS.      - DEPTH OF INVASION: 3.0 MM.      - ENDOCERVICAL ADENOCARCINOMA IN SITU INVOLVING 6:00-9:00, 9:00-12:00, AND      12:00-3:00 QUADRANTS.      - HIGH-GRADE SQUAMOUS INTRAEPITHELIAL LESION (HSIL/CIN-3) INVOLVING 6:00-9:00      QUADRANT.      - ALL MARGINS ARE NEGATIVE FOR DYSPLASIA AND MALIGNANCY.      - SEE NOTE.       2. Endocervix, curettage,  :      - ENDOCERVIX WITH THERMAL ARTIFACT.      - DYSPLASIA AND MALIGNANCY ARE NOT IDENTIFIED.       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    [facility] review below. Original [facility] review had only shown 3 mm invasion instead of 4 mm, so now stage IA2.    Outside pathology [facility] review A.  Outside case, [phone], [facility], [place], [place].  Date of procedure 12/09/23:    1.  Cervix, cold knife cone excision: - Invasive endocervical adenocarcinoma, HPV associated, with associated endocervical adenocarcinoma in situ and focal high grade squamous intraepithelial lesion (HSIL)/cervical intraepithelial neoplasia-2 (CIN-2). Depth of invasion: 4 mm. Horizontal size: 10 mm. Lymphatic/vascular invasion: Absent. Silva pattern B.  All margins of resection are negative for invasive and in situ carcinoma. 2.  Endocervix, curettage: - Fragments of endocervix. - Negative for dysplasia or carcinoma. Comment: Per the outside report, a p16 immunohistochemical stain on a  prior cervical biopsy was positive (slide not submitted for review)  Based on stage IA2 disease s/p cone biopsy with negative margins she underwent RA TLH/BSO and SLN biopsies on 01/27/24.  She does not desire future fertility.   Slides were reviewed  at [facility] for [facility] opinion yesterday.  Dr. Alejos does not think there is residual adenocarcinoma or AIS, but rather inflamed endocervical glands.  He plans to do p16 IHC on the key blocks to confirm this conclusion.    FINAL DIAGNOSIS 1. Lymph node, sentinel, biopsy, left :      - ONE LYMPH NODE NEGATIVE FOR MALIGNANCY (0/1). 2. Lymph node, sentinel, biopsy, right :      - TWO LYMPH NODES NEGATIVE FOR MALIGNANCY (0/2). 3. Vagina, biopsy, posterior cuff :      - CAUTERIZED ECTO/ENDOCERVICAL JUNCTIONAL TISSUE WITH AREAS OF HIGH-GRADE      DYSPLASIA (HSIL), FOCALLY EXTENDING TO CAUTERIZED ENDOCERVICAL EDGE OF SPECIMEN.      - CHANGES CONSISTENT WITH PRIOR SURGICAL PROCEDURE. 4. Cervix, biopsy, left anterior :      - SQUAMOUS ECTOCERVICAL TISSUE WITH LOW-GRADE SQUAMOUS INTRAEPITHELIAL LESION      (LSIL/CIN-1), FOCALLY EXTENDING TO CAUTERIZED EDGES.      - CHANGES CONSISTENT WITH PRIOR SURGICAL PROCEDURE. 5. Cervix, biopsy, left 9 to 6 :      - SQUAMOUS ECTOCERVICAL TISSUE WITH LOW-GRADE SQUAMOUS INTRAEPITHELIAL LESION      (LSIL/CIN-1), FOCALLY EXTENDING TO CAUTERIZED EDGES.      - CHANGES CONSISTENT WITH PRIOR SURGICAL PROCEDURE. 6. Uterus, cervix and bilateral fallopian tubes,  :      - RESIDUAL INVASIVE ENDOCERVICAL ADENOCARCINOMA.      - ENDOCERVICAL ADENOCARCINOMA IN SITU (AIS).      - HIGH-GRADE SQUAMOUS INTRAEPITHELIAL LESION (HSIL/CIN-3).      - SEE CANCER SUMMARY BELOW.      - CHANGES CONSISTENT WITH PRIOR SURGICAL PROCEDURE.      - WEAKLY PROLIFERATIVE ENDOMETRIUM.      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      - BILATERAL FALLOPIAN TUBES WITH FIMBRIATED END AND PARATUBAL CYSTS. Diagnosis Note : In the hysterectomy specimen (specimen 6) residual invasive endocervical adenocarcinoma is present with the largest linear focus measuring 3.8 mm.In one tangential section of the cervix (limiting measurement of depth of invasion) adenocarcnoma is present 0.3 mm from the  radial / circumferential margin in the 6:00-9:00 quadrant location.In a well oriented section of the cervix the deepest depth of invasion measures 7.8 mm where the wall thickness measures 11.4 mm.BCL2 and p16 stains were performed to confirm the presence of adenocarcinoma (p16 positive BCL2 negative).Stain controls worked appropriately. A subset of slides on specimen 6 underwent intradepartmental consultation and Dr. Macie and Dr. Delaine concur with the interpretation.  Slides were reviewed at [facility] for second opinion. Dr. Alejos does not think there is residual adenocarcinoma or AIS, but rather inflamed endocervical glands.  He plans to do p16 IHC on the key blocks to confirm this conclusion.   02/15/24- PET scan done earlier this week and shows activity by the vaginal cuff and activity in a nodule in the lower abdomen.      Past Medical History:  Diagnosis Date   Adenocarcinoma in situ of endocervix    Anemia    Asymptomatic varicose veins of left lower extremity    Axillary hidradenitis suppurativa    Bilateral carpal tunnel syndrome    a.) (+) associated paraesthesias to BILATERAL hands   Gestational hypertension  has had issues with blood pressure since pregnancy off and on   Joint pain    Migraines    Raynaud's disease    Sleep apnea    Past Surgical History:  Procedure Laterality Date   BREAST BIOPSY Left 12/11/2022   US  LT BREAST BX W LOC DEV 1ST LESION IMG BX SPEC US  GUIDE 12/11/2022 [facility]   CARPAL TUNNEL RELEASE Right 06/30/2018   Procedure: CARPAL TUNNEL RELEASE ENDOSCOPIC;  Surgeon: Edie Norleen PARAS, MD;  Location: [facility];  Service: Orthopedics;  Laterality: Right;   CARPAL TUNNEL RELEASE Left 06/18/2021   Procedure: CARPAL TUNNEL RELEASE ENDOSCOPIC;  Surgeon: Edie Norleen PARAS, MD;  Location: [facility];  Service: Orthopedics;  Laterality: Left;   CERVICAL CONIZATION W/BX N/A 12/09/2023   Procedure: CONE BIOPSY, CERVIX; ENDOCERVICAL CURETTINGS;  Surgeon:  Leonce Garnette BIRCH, MD;  Location: [facility];  Service: Gynecology;  Laterality: N/A;   EXAM UNDER ANESTHESIA, PELVIC N/A 01/27/2024   Procedure: EXAM UNDER ANESTHESIA, PELVIC;  Surgeon: Elby Webb Loges, MD;  Location: [facility];  Service: Gynecology;  Laterality: N/A;   INJECTION, FOR SENTINEL LYMPH NODE IDENTIFICATION Bilateral 01/27/2024   Procedure: INJECTION, FOR SENTINEL LYMPH NODE IDENTIFICATION;  Surgeon: Elby Webb Loges, MD;  Location: [facility];  Service: Gynecology;  Laterality: Bilateral;   LYMPH NODE BIOPSY Bilateral 01/27/2024   Procedure: LYMPH NODE BIOPSY;  Surgeon: Elby Webb Loges, MD;  Location: [facility];  Service: Gynecology;  Laterality: Bilateral;   ROBOTIC ASSISTED TOTAL HYSTERECTOMY WITH BILATERAL SALPINGO OOPHERECTOMY Bilateral 01/27/2024   Procedure: HYSTERECTOMY, TOTAL, ROBOT-ASSISTED, LAPAROSCOPIC, WITH BILATERAL SALPINGECTOMY;  Surgeon: Elby Webb Loges, MD;  Location: [facility];  Service: Gynecology;  Laterality: Bilateral;   OB History  Gravida Para Term Preterm AB Living  1 1 1   1   SAB IAB Ectopic Multiple Live Births     0 1    # Outcome Date GA Lbr Len/2nd Weight Sex Type Anes PTL Lv  1 Term 01/26/18 [redacted]w[redacted]d / 02:42 6 lb 9.8 oz (3 kg) M Vag-Spont EPI  LIV  Patient denies any other pertinent gynecologic issues.   Current Outpatient Medications on File Prior to Visit  Medication Sig Dispense Refill   ibuprofen  (ADVIL ) 600 MG tablet Take 1 tablet (600 mg total) by mouth every 6 (six) hours. 30 tablet 0   ondansetron  (ZOFRAN -ODT) 4 MG disintegrating tablet Take 1 tablet (4 mg total) by mouth every 6 (six) hours as needed for nausea. 20 tablet 0   oxyCODONE -acetaminophen  (PERCOCET) 5-325 MG tablet Take 1 tablet by mouth every 6 (six) hours as needed (breakthrough). 20 tablet 0   zonisamide (ZONEGRAN) 100 MG capsule Take 100 mg by mouth daily.     No current facility-administered medications on file prior to visit.   Allergies  Allergen  Reactions   Penicillins Other (See Comments)    Doesn't know symptoms last had it as a child  As a child    Social History:   reports that she has quit smoking. Her smoking use included cigarettes. She has never used smokeless tobacco. She reports that she does not currently use alcohol. She reports that she does not use drugs.  Family History  Problem Relation Age of Onset   Breast cancer Neg Hx    Review of Systems General:  no complaints Skin: no complaints Eyes: no complaints HEENT: no complaints Breasts: no complaints Pulmonary: no complaints Cardiac: no complaints Gastrointestinal: no complaints Genitourinary/Sexual: no complaints Ob/Gyn: no complaints Musculoskeletal: no complaints Hematology: no  complaints Neurologic/Psych: no complaints   OBJECTIVE:  PHYSICAL EXAM: Blood pressure 126/81, pulse 96, temperature 98.3 F (36.8 C), resp. rate 17, weight 220 lb 3.2 oz (99.9 kg), last menstrual period 01/19/2024, SpO2 97%.  ECOG - 0   GENERAL: Patient is a well appearing female in no acute distress HEENT:  Sclera clear. Anicteric NODES:  Negative axillary, supraclavicular, inguinal lymph node survery LUNGS:  Clear to auscultation bilaterally.   HEART:  Regular rate and rhythm.  ABDOMEN:  Soft, nontender.  No hernias, incisions well healed. No masses or ascites EXTREMITIES:  No peripheral edema. Atraumatic. No cyanosis SKIN:  Clear with no obvious rashes or skin changes.  NEURO:  Nonfocal. Well oriented.  Appropriate affect.  Pelvic: Chaperoned by CMA EGBUS: no lesions Cervix: absent Vagina: no lesions, cuff healing well, no discharge or bleeding Uterus: absent Adnexa: no palpable masses Bimanual: normal post op induration at cuff.    Labs No labs on site today  Imaging Per hpi   Assessment and Plan:  Theresa Davidson is a 43 y.o. 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 Cone biopsy with IUD removal  12/09/23. Stage 1A2 Adenocarcinoma of the cervix with 4 mm invasion, no LVSI and negative margins.  Silva pattern B. She does not desire future fertility. She underwent RA TLH/BS and SLN biopsies on 01/27/24.  Ovaries preserved.  [facility] pathology showed negative SLNs, but persistent adenocarcinoma in the cervix with 7 mm invasion and disease within 0.3 mm of cervical margin.  Slides were reviewed at [facility] for second opinion.  Dr. Alejos does not think there is residual adenocarcinoma or AIS, but rather inflamed endocervical glands.  He plans to do p16 IHC on the key blocks to confirm this conclusion and then will issue a formal report.    PET scan was ordered prior to Dr Matt review and showed some activity in the vaginal cuff that is likely post op changes.  There was also a 2.3 cm PET positive nodule in the right lower abdomen. Dr Mancil discussed with Dr Jeremy in Radiology and she suggested a CT scan with oral and IV contrast to better delineate this finding, which I suspect represents activity in her residual right ovary. CT A/P has been performed but results are pending as are additional staining that was performed by Dr Alejos. Recommend patient follow up with Dr Mancil next week to review results. She's in agreement.   Vaginal spotting- no evidence of bleeding on exam. Suspect healing. Continue pelvic rest.   Theresa KANDICE Dawn, NP

## 2024-03-09 ENCOUNTER — Inpatient Hospital Stay (HOSPITAL_BASED_OUTPATIENT_CLINIC_OR_DEPARTMENT_OTHER): Admitting: Obstetrics and Gynecology

## 2024-03-09 VITALS — BP 133/80 | HR 79 | Resp 18 | Ht 64.0 in | Wt 221.0 lb

## 2024-03-09 DIAGNOSIS — Z9079 Acquired absence of other genital organ(s): Secondary | ICD-10-CM

## 2024-03-09 DIAGNOSIS — C539 Malignant neoplasm of cervix uteri, unspecified: Secondary | ICD-10-CM

## 2024-03-09 DIAGNOSIS — Z90722 Acquired absence of ovaries, bilateral: Secondary | ICD-10-CM

## 2024-03-09 DIAGNOSIS — Z90710 Acquired absence of both cervix and uterus: Secondary | ICD-10-CM

## 2024-03-09 NOTE — Progress Notes (Signed)
 Gynecologic Oncology Interval Visit  RMD: Dr. Garnette Mace  Chief complaints: stage IA2 cervical adenocarcinoma.  History of Present Illness: Theresa Davidson is a 43 y.o. G1P1 female who presents for management of stage IA2 cervical adenocarcinoma.  Pathology from hysterectomy reviewed by Dr Alejos at [facility] and additional p16 staining done on residual cervix.  Final conclusion is that the cervix does not have residual cancer, just normal endocervical glands.  She had some spotting and exam normal with Tinnie Dawn.     DIAGNOSIS   A.  Outside case, [phone], [facility], [place], [place].  Date of procedure 01/27/2024:    1.  Left sentinel lymph node, lymphadenectomy:   One lymph node, negative for malignancy (0/1).   2.  Right sentinel lymph node, lymphadenectomy:   Two lymph nodes, negative for malignancy (0/2).   3.  Vagina, posterior cuff, excision:   Ectocervix with cautery artifact, see comment.   Comment: There is a small fragment of partially cauterized tissue that resembles high-grade dysplasia, but cautery artifact is favored.   4.  Left anterior cervix, excision:   Ectocervix, negative for dysplasia, negative for malignancy.   5.  Left cervix, 9:00 to 6:00, excision:   Ectocervix, negative for dysplasia, negative for malignancy.   6.  Uterus, cervix, and bilateral fallopian tubes, simple hysterectomy and bilateral salpingectomy:   Cervix with cone excision site and extensive tubal metaplasia. Negative for residual adenocarcinoma, negative for squamous dysplasia.   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 Bilateral fallopian tubes with paratubal cysts, no pathologic diagnosis.   Comment: The outside diagnosis of residual invasive endocervical adenocarcinoma was considered, and there are certainly glands deep in the cervix which are hyperchromatic and resemble the prior endocervical adenocarcinoma to some degree.  However, there are retained  cilia on these cells, and no mitotic activity or apoptosis is identified.  P16 immunohistochemical stains of multiple block are negative in the cells, in contrast to the prior invasive adenocarcinoma in the cold knife cone, which was HPV associated and strongly p16 positive (reviewed previously at [facility], see [id number]).  It should be noted that tubal metaplasia is a well-documented mimic of endocervical adenocarcinoma.  Drs. Lauraine Freshwater and Doyal Chalk have also reviewed this case and agree it is negative for residual adenocarcinoma.    Slides were reviewed at [facility] for second opinion yesterday.  Dr. Alejos does not think there is residual adenocarcinoma or AIS, but rather inflamed endocervical glands.  He plans to do p16 IHC on the key blocks to confirm this conclusion.   PET scan done earlier this week and shows activity by the vaginal cuff and activity in a nodule in the lower abdomen.    Gynecologic Oncology History  Theresa Davidson is a pleasant G1P1 female who has cervical adenocarcinoma. She intially presented with cervical adenocarcinoma in situ.   09/16/23 seen for annual Gyn exam.  No bleeding or discharge.     Pap History:  2016- NILM, hpv not performed 09/08/2017- NILM, hpv not performed 12/10/2020- NILM, HPV Positive (12 mo f/u rec) 09/16/23- Endocervical adenocarcinoma in situ, HR HPV Positive. Genotype not performed   10/19/23- Cervix was reddened, lobular, irregular surface exophytic lesion w/o regular borders emanating from edge of cervix. Blue outline is more regular shaped lesion similar in appearance. Colposcopy performed with aceto-white lesions at 10-11, 8, 5:00. Biopsies performed. ECC performed.    Cervical Biopsy, 2:00: ENDOCERVICAL ADENOCARCINOMA-IN-SITU.  Cervical Biopsy, 5:00: LOW-GRADE SQUAMOUS INTRAEPITHELIAL LESION (CIN 1).  Cervical  Biopsy, 8:00: ENDOCERVICAL ADENOCARCINOMA-IN-SITU.  Cervical Biopsy,10:00: ENDOCERVICAL ADENOCARCINOMA-IN-SITU.  Endocervical Curettings:  ENDOCERVICAL ADENOCARCINOMA-IN-SITU.    PAP 2022 negative, HPV HR+   PAP 3/19 normal    IUD for contraception  Cold Knife Conization was recommended and done 12/09/23. FINAL DIAGNOSIS       1. Cervix, cone, Cone biopsy stitch at 12 o'clock :      - ENDOCERVICAL ADENOCARCINOMA INVOLVING 9:00-12:00 AND 12:00-3:00 QUADRANTS.      - DEPTH OF INVASION: 3.0 MM.      - ENDOCERVICAL ADENOCARCINOMA IN SITU INVOLVING 6:00-9:00, 9:00-12:00, AND      12:00-3:00 QUADRANTS.      - HIGH-GRADE SQUAMOUS INTRAEPITHELIAL LESION (HSIL/CIN-3) INVOLVING 6:00-9:00      QUADRANT.      - ALL MARGINS ARE NEGATIVE FOR DYSPLASIA AND MALIGNANCY.      - SEE NOTE.       2. Endocervix, curettage,  :      - ENDOCERVIX WITH THERMAL ARTIFACT.      - DYSPLASIA AND MALIGNANCY ARE NOT IDENTIFIED.       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    [facility] review below. Original [facility] review had only shown 3 mm invasion instead of 4 mm, so now stage IA2.    Outside pathology [facility] review A.  Outside case, [phone], [facility], [place], [place].  Date of procedure 12/09/23:    1.  Cervix, cold knife cone excision:   Invasive endocervical adenocarcinoma, HPV associated, with associated endocervical adenocarcinoma in situ and focal high grade squamous intraepithelial lesion (HSIL)/cervical intraepithelial neoplasia-2 (CIN-2). Depth of invasion: 4 mm. Horizontal size: 10 mm. Lymphatic/vascular invasion: Absent. Silva pattern B.  All margins of resection are negative for invasive and in situ carcinoma.   2.  Endocervix, curettage:   Fragments of endocervix. Negative for dysplasia or carcinoma.   Comment: Per the outside report, a p16 immunohistochemical stain on a prior cervical biopsy was positive (slide not submitted for  review)   Based on stage IA2 disease s/p cone biopsy with negative margins she underwent RA TLH/BSO and SLN biopsies on 01/27/24.  She does not desire future fertility.    Slides were reviewed at [facility] for second opinion.  Dr. Alejos does not think there is residual adenocarcinoma or AIS, but rather inflamed endocervical glands.  He plans to do p16 IHC on the key blocks to confirm this conclusion.   FINAL DIAGNOSIS      1. Lymph node, sentinel, biopsy, left :      - ONE LYMPH NODE NEGATIVE FOR MALIGNANCY (0/1).       2. Lymph node, sentinel, biopsy, right :      - TWO LYMPH NODES NEGATIVE FOR MALIGNANCY (0/2).       3. Vagina, biopsy, posterior cuff :      - CAUTERIZED ECTO/ENDOCERVICAL JUNCTIONAL TISSUE WITH AREAS OF HIGH-GRADE      DYSPLASIA (HSIL), FOCALLY EXTENDING TO CAUTERIZED ENDOCERVICAL EDGE OF SPECIMEN.      - CHANGES CONSISTENT WITH PRIOR SURGICAL PROCEDURE.       4. Cervix, biopsy, left anterior :      - SQUAMOUS ECTOCERVICAL TISSUE WITH LOW-GRADE SQUAMOUS INTRAEPITHELIAL LESION      (LSIL/CIN-1), FOCALLY EXTENDING TO CAUTERIZED EDGES.      - CHANGES CONSISTENT WITH PRIOR SURGICAL PROCEDURE.       5. Cervix, biopsy,  left 9 to 6 :      - SQUAMOUS ECTOCERVICAL TISSUE WITH LOW-GRADE SQUAMOUS INTRAEPITHELIAL LESION      (LSIL/CIN-1), FOCALLY EXTENDING TO CAUTERIZED EDGES.      - CHANGES CONSISTENT WITH PRIOR SURGICAL PROCEDURE.       6. Uterus, cervix and bilateral fallopian tubes,  :      - RESIDUAL INVASIVE ENDOCERVICAL ADENOCARCINOMA.      - ENDOCERVICAL ADENOCARCINOMA IN SITU (AIS).      - HIGH-GRADE SQUAMOUS INTRAEPITHELIAL LESION (HSIL/CIN-3).      - SEE CANCER SUMMARY BELOW.      - CHANGES CONSISTENT WITH PRIOR SURGICAL PROCEDURE.      - WEAKLY PROLIFERATIVE ENDOMETRIUM.      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      - BILATERAL FALLOPIAN TUBES WITH FIMBRIATED END AND PARATUBAL CYSTS.       Diagnosis Note : In the hysterectomy specimen (specimen  6) residual invasive      endocervical adenocarcinoma is present with the largest linear focus measuring      3.8 mm.In one tangential section of the cervix (limiting measurement of depth of      invasion) adenocarcnoma is present 0.3 mm from the radial / circumferential      margin in the 6:00-9:00 quadrant location.In a well oriented section of the      cervix the deepest depth of invasion measures 7.8 mm where the wall thickness      measures 11.4 mm.BCL2 and p16 stains were performed to confirm the presence of      adenocarcinoma (p16 positive BCL2 negative).Stain controls worked appropriately.      A subset of slides on specimen 6 underwent intradepartmental consultation and      Dr. Macie and Dr. Delaine concur with the interpretation.     Past Medical History:  Diagnosis Date   Adenocarcinoma in situ of endocervix    Anemia    Asymptomatic varicose veins of left lower extremity    Axillary hidradenitis suppurativa    Bilateral carpal tunnel syndrome    a.) (+) associated paraesthesias to BILATERAL hands   Gestational hypertension    has had issues with blood pressure since pregnancy off and on   Joint pain    Migraines    Raynaud's disease    Sleep apnea    Past Surgical History:  Procedure Laterality Date   BREAST BIOPSY Left 12/11/2022   US  LT BREAST BX W LOC DEV 1ST LESION IMG BX SPEC US  GUIDE 12/11/2022 [facility]   CARPAL TUNNEL RELEASE Right 06/30/2018   Procedure: CARPAL TUNNEL RELEASE ENDOSCOPIC;  Surgeon: Edie Norleen PARAS, MD;  Location: [facility];  Service: Orthopedics;  Laterality: Right;   CARPAL TUNNEL RELEASE Left 06/18/2021   Procedure: CARPAL TUNNEL RELEASE ENDOSCOPIC;  Surgeon: Edie Norleen PARAS, MD;  Location: [facility];  Service: Orthopedics;  Laterality: Left;   CERVICAL CONIZATION W/BX N/A 12/09/2023   Procedure: CONE BIOPSY, CERVIX; ENDOCERVICAL CURETTINGS;  Surgeon: Leonce Garnette BIRCH, MD;  Location: [facility];  Service: Gynecology;  Laterality:  N/A;   EXAM UNDER ANESTHESIA, PELVIC N/A 01/27/2024   Procedure: EXAM UNDER ANESTHESIA, PELVIC;  Surgeon: Elby Webb Loges, MD;  Location: [facility];  Service: Gynecology;  Laterality: N/A;   INJECTION, FOR SENTINEL LYMPH NODE IDENTIFICATION Bilateral 01/27/2024   Procedure: INJECTION, FOR SENTINEL LYMPH NODE IDENTIFICATION;  Surgeon: Elby Webb Loges, MD;  Location: [facility];  Service: Gynecology;  Laterality: Bilateral;   LYMPH NODE BIOPSY Bilateral  01/27/2024   Procedure: LYMPH NODE BIOPSY;  Surgeon: Elby Webb Loges, MD;  Location: [facility];  Service: Gynecology;  Laterality: Bilateral;   ROBOTIC ASSISTED TOTAL HYSTERECTOMY WITH BILATERAL SALPINGO OOPHERECTOMY Bilateral 01/27/2024   Procedure: HYSTERECTOMY, TOTAL, ROBOT-ASSISTED, LAPAROSCOPIC, WITH BILATERAL SALPINGECTOMY;  Surgeon: Elby Webb Loges, MD;  Location: [facility];  Service: Gynecology;  Laterality: Bilateral;   OB History  Gravida Para Term Preterm AB Living  1 1 1   1   SAB IAB Ectopic Multiple Live Births     0 1    # Outcome Date GA Lbr Len/2nd Weight Sex Type Anes PTL Lv  1 Term 01/26/18 [redacted]w[redacted]d / 02:42 6 lb 9.8 oz (3 kg) M Vag-Spont EPI  LIV  Patient denies any other pertinent gynecologic issues.   Current Outpatient Medications on File Prior to Visit  Medication Sig Dispense Refill   ibuprofen  (ADVIL ) 600 MG tablet Take 1 tablet (600 mg total) by mouth every 6 (six) hours. 30 tablet 0   ondansetron  (ZOFRAN -ODT) 4 MG disintegrating tablet Take 1 tablet (4 mg total) by mouth every 6 (six) hours as needed for nausea. 20 tablet 0   oxyCODONE -acetaminophen  (PERCOCET) 5-325 MG tablet Take 1 tablet by mouth every 6 (six) hours as needed (breakthrough). 20 tablet 0   zonisamide (ZONEGRAN) 100 MG capsule Take 100 mg by mouth daily.     No current facility-administered medications on file prior to visit.   Allergies  Allergen Reactions   Penicillins Other (See Comments)    Doesn't know symptoms last had it  as a child  As a child    Social History:   reports that she has quit smoking. Her smoking use included cigarettes. She has never used smokeless tobacco. She reports that she does not currently use alcohol. She reports that she does not use drugs.  Family History  Problem Relation Age of Onset   Breast cancer Neg Hx     Review of Systems:  General: no complaints  HEENT: no complaints  Lungs: no complaints  Cardiac: no complaints  GI: decreased appetite o/w no complaints  GU: no complaints GYN: vaginal bleeding due to menses  Musculoskeletal: no complaints  Extremities: no complaints  Skin: no complaints  Neuro: no complaints  Endocrine: no complaints  Psych: no complaints       PHYSICAL EXAM: Blood pressure 133/80, pulse 79, resp. rate 18, height 5' 4 (1.626 m), weight 221 lb (100.2 kg), last menstrual period 01/19/2024, SpO2 98%.  ECOG - 0   GENERAL: Patient is a well appearing female in no acute distress HEENT:  PERRL, neck supple with midline trachea. Thyroid without masses.  NODES:  No cervical, supraclavicular, axillary, or inguinal lymphadenopathy palpated.  LUNGS:  Clear to auscultation bilaterally.   HEART:  Regular rate and rhythm.  ABDOMEN:  Soft, nontender. Nondistended. No ascites or masses. Incisions well healed.  EXTREMITIES:  No peripheral edema.   SKIN:  Clear with no obvious rashes or skin changes.  NEURO:  Nonfocal. Well oriented.  Appropriate affect.  Pelvic: deferred    Labs Lab Results  Component Value Date   WBC 7.2 12/09/2023   HGB 14.3 12/09/2023   HCT 41.9 12/09/2023   MCV 85.9 12/09/2023   PLT 242 12/09/2023    Imaging PET scan 02/15/24  NECK: Left maxillary periodontal inflammatory/infectious uptake. No abnormal hypermetabolism.   Incidental CT findings:   None.   CHEST:   No abnormal hypermetabolism.   Incidental CT findings:   Heart is  enlarged.  No pericardial or pleural effusion.   ABDOMEN/PELVIS:   Asymmetric  ill-defined soft tissue and stranding in the region of the left vaginal cuff (6/133), SUV max 22.9, status post recent hysterectomy. Focal hypermetabolism associated with a mesenteric nodule or possible serosal implant in the posterior central anatomic pelvis, measuring approximately 2.3 cm (6/120), SUV max 16.3. Tiny periaortic lymph nodes do not show metabolism above blood pool but are visualized.   Incidental CT findings:   Small low-attenuation lesion in the inferior right hepatic lobe, too small to characterize.   SKELETON:   No abnormal hypermetabolism.   Incidental CT findings:   Minimal degenerative change in the spine.   IMPRESSION: 1. Recent hysterectomy. Associated asymmetric hypermetabolism along the vaginal cuff is presumably postoperative in etiology. This examination will serve as baseline for future comparison. 2. Focal hypermetabolism in the small bowel mesentery or along the small bowel in the posterior central anatomic pelvis, worrisome for a metastasis. Consider CT or MR enterography in further evaluation, as clinically indicated. 3. Tiny retroperitoneal periaortic lymph nodes are not hypermetabolic but are visualized. Recommend attention on follow-up.  Assessment and plan:   Theresa Davidson is a 43 y.o. 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 Cone biopsy with IUD removal 12/09/23. Stage 1A2 Adenocarcinoma of the cervix with 4 mm invasion, no LVSI and negative margins.  Silva pattern B. She does not desire future fertility. She underwent RA TLH/BS and SLN biopsies on 01/27/24.  Ovaries preserved.  [facility] pathology showed negative SLNs, but persistent adenocarcinoma in the cervix with 7 mm invasion and disease within 0.3 mm of cervical margin.  Slides were reviewed at [facility] for second opinion.  Dr. Alejos does not think there is residual adenocarcinoma or AIS, but rather inflamed endocervical glands.  He did p16  IHC on the key blocks, which confirmed this conclusion and discussed this with Dr Janel at [facility] pathology.    PET scan was ordered prior to Dr Matt review and showed some activity in the vaginal cuff that is likely post op changes.  There was also a 2.3 cm PET positive nodule in the right lower abdomen.  I discussed with Dr Jeremy in Radiology and she suggested a CT scan with oral and IV contrast to better delineate this finding, which I suspect represents activity in her residual right ovary. I reviewed the CT scan today with Dr Minus in Radiology and he confirms post op changes in the pelvis and that the right pelvic activity is corresponds to the right ovary, which is still physiologically active.   Normal post op course.  In view of the final pathology and radiology results will plan for expectant management of stage IA2 endocervical carcinoma with negative SLNs.   She will RTC in 3 months and will obtain follow up US  to look at ovaries again and PAP smear.     Prentice Agent, MD   Cc: Garnette Mace, MD 03/09/2024 11:09 AM

## 2024-06-01 ENCOUNTER — Ambulatory Visit
Admission: RE | Admit: 2024-06-01 | Discharge: 2024-06-01 | Disposition: A | Discharge status: Home / Self Care | Source: Ambulatory Visit | Attending: Nurse Practitioner | Admitting: Nurse Practitioner

## 2024-06-01 DIAGNOSIS — C539 Malignant neoplasm of cervix uteri, unspecified: Secondary | ICD-10-CM | POA: Insufficient documentation

## 2024-06-06 ENCOUNTER — Telehealth: Payer: Self-pay | Admitting: Obstetrics and Gynecology

## 2024-06-06 NOTE — Telephone Encounter (Signed)
 Pt called and requested to move appt back to after Christmas due to car breaking down - won't have second car til then - [facility]

## 2024-06-08 ENCOUNTER — Inpatient Hospital Stay

## 2024-06-08 ENCOUNTER — Ambulatory Visit

## 2024-06-15 ENCOUNTER — Inpatient Hospital Stay

## 2024-07-20 ENCOUNTER — Telehealth: Payer: Self-pay | Admitting: Obstetrics and Gynecology

## 2024-07-20 ENCOUNTER — Inpatient Hospital Stay: Attending: Obstetrics and Gynecology | Admitting: Obstetrics and Gynecology

## 2024-07-20 VITALS — BP 132/80 | HR 89 | Temp 97.0°F | Resp 20 | Wt 222.5 lb

## 2024-07-20 DIAGNOSIS — Z8541 Personal history of malignant neoplasm of cervix uteri: Secondary | ICD-10-CM | POA: Diagnosis present

## 2024-07-20 DIAGNOSIS — Z9079 Acquired absence of other genital organ(s): Secondary | ICD-10-CM | POA: Insufficient documentation

## 2024-07-20 DIAGNOSIS — N898 Other specified noninflammatory disorders of vagina: Secondary | ICD-10-CM | POA: Diagnosis not present

## 2024-07-20 DIAGNOSIS — C539 Malignant neoplasm of cervix uteri, unspecified: Secondary | ICD-10-CM

## 2024-07-20 DIAGNOSIS — Z90710 Acquired absence of both cervix and uterus: Secondary | ICD-10-CM | POA: Insufficient documentation

## 2024-07-20 DIAGNOSIS — R109 Unspecified abdominal pain: Secondary | ICD-10-CM | POA: Diagnosis not present

## 2024-07-20 DIAGNOSIS — Z90722 Acquired absence of ovaries, bilateral: Secondary | ICD-10-CM | POA: Insufficient documentation

## 2024-07-20 NOTE — Progress Notes (Signed)
 Gynecologic Oncology Interval Visit  RMD: Dr. Garnette Mace  Chief complaints: stage IA2 cervical adenocarcinoma.  History of Present Illness: Theresa Davidson is a 44 y.o. G1P1 female who presents for management of stage IA2 cervical adenocarcinoma.  She presents for surveillance and repeat Pap smear.  She is otherwise doing well other than abdominal pain that she has had in the left lower quadrant for the past few months.  She did not have any concerns regarding constipation or bowel issues.  She had a recent ultrasound that was reassuring.  06/01/2024 pelvic ultrasound IMPRESSION: Status post hysterectomy. Normal sonographic appearance of the ovaries.  Gynecologic Oncology History  Theresa Davidson is a pleasant G1P1 female who has cervical adenocarcinoma. She intially presented with cervical adenocarcinoma in situ.   09/16/23 seen for annual Gyn exam.  No bleeding or discharge.     Pap History:  2016- NILM, hpv not performed 09/08/2017- NILM, hpv not performed 12/10/2020- NILM, HPV Positive (12 mo f/u rec) 09/16/23- Endocervical adenocarcinoma in situ, HR HPV Positive. Genotype not performed   10/19/23- Cervix was reddened, lobular, irregular surface exophytic lesion w/o regular borders emanating from edge of cervix. Blue outline is more regular shaped lesion similar in appearance. Colposcopy performed with aceto-white lesions at 10-11, 8, 5:00. Biopsies performed. ECC performed.    Cervical Biopsy, 2:00: ENDOCERVICAL ADENOCARCINOMA-IN-SITU.  Cervical Biopsy, 5:00: LOW-GRADE SQUAMOUS INTRAEPITHELIAL LESION (CIN 1).  Cervical Biopsy, 8:00: ENDOCERVICAL ADENOCARCINOMA-IN-SITU.  Cervical Biopsy,10:00: ENDOCERVICAL ADENOCARCINOMA-IN-SITU.  Endocervical Curettings: ENDOCERVICAL ADENOCARCINOMA-IN-SITU.    PAP 2022 negative, HPV HR+   PAP 3/19 normal    IUD for contraception  Cold Knife Conization was recommended and done 12/09/23. FINAL DIAGNOSIS       1. Cervix, cone, Cone biopsy stitch  at 12 o'clock :      - ENDOCERVICAL ADENOCARCINOMA INVOLVING 9:00-12:00 AND 12:00-3:00 QUADRANTS.      - DEPTH OF INVASION: 3.0 MM.      - ENDOCERVICAL ADENOCARCINOMA IN SITU INVOLVING 6:00-9:00, 9:00-12:00, AND      12:00-3:00 QUADRANTS.      - HIGH-GRADE SQUAMOUS INTRAEPITHELIAL LESION (HSIL/CIN-3) INVOLVING 6:00-9:00      QUADRANT.      - ALL MARGINS ARE NEGATIVE FOR DYSPLASIA AND MALIGNANCY.      - SEE NOTE.       2. Endocervix, curettage,  :      - ENDOCERVIX WITH THERMAL ARTIFACT.      - DYSPLASIA AND MALIGNANCY ARE NOT IDENTIFIED.       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    [facility] review below. Original [facility] review had only shown 3 mm invasion instead of 4 mm, so now stage IA2.    Outside pathology [facility] review A.  Outside case, [phone], [facility], [place], [place].  Date of procedure 12/09/23:    1.  Cervix, cold knife cone excision:   Invasive endocervical adenocarcinoma, HPV associated, with associated endocervical adenocarcinoma in situ and focal high grade squamous intraepithelial lesion (HSIL)/cervical intraepithelial neoplasia-2 (CIN-2). Depth of invasion: 4 mm. Horizontal size: 10 mm. Lymphatic/vascular invasion: Absent. Silva pattern B.  All margins of resection are negative for invasive and in situ carcinoma.   2.  Endocervix, curettage:   Fragments of endocervix. Negative for dysplasia or carcinoma.   Comment: Per the outside report, a p16 immunohistochemical stain on a prior cervical biopsy was positive (slide  not submitted for review)   Based on stage IA2 disease s/p cone biopsy with negative margins she underwent RA TLH/BSO and SLN biopsies on 01/27/24.  She does not desire future fertility.   Slides were reviewed at [facility] for second opinion.  Dr. Alejos does  not think there is residual adenocarcinoma or AIS, but rather inflamed endocervical glands.   FINAL DIAGNOSIS      1. Lymph node, sentinel, biopsy, left :      - ONE LYMPH NODE NEGATIVE FOR MALIGNANCY (0/1).       2. Lymph node, sentinel, biopsy, right :      - TWO LYMPH NODES NEGATIVE FOR MALIGNANCY (0/2).       3. Vagina, biopsy, posterior cuff :      - CAUTERIZED ECTO/ENDOCERVICAL JUNCTIONAL TISSUE WITH AREAS OF HIGH-GRADE      DYSPLASIA (HSIL), FOCALLY EXTENDING TO CAUTERIZED ENDOCERVICAL EDGE OF SPECIMEN.      - CHANGES CONSISTENT WITH PRIOR SURGICAL PROCEDURE.       4. Cervix, biopsy, left anterior :      - SQUAMOUS ECTOCERVICAL TISSUE WITH LOW-GRADE SQUAMOUS INTRAEPITHELIAL LESION      (LSIL/CIN-1), FOCALLY EXTENDING TO CAUTERIZED EDGES.      - CHANGES CONSISTENT WITH PRIOR SURGICAL PROCEDURE.       5. Cervix, biopsy, left 9 to 6 :      - SQUAMOUS ECTOCERVICAL TISSUE WITH LOW-GRADE SQUAMOUS INTRAEPITHELIAL LESION      (LSIL/CIN-1), FOCALLY EXTENDING TO CAUTERIZED EDGES.      - CHANGES CONSISTENT WITH PRIOR SURGICAL PROCEDURE.       6. Uterus, cervix and bilateral fallopian tubes,  :      - RESIDUAL INVASIVE ENDOCERVICAL ADENOCARCINOMA.      - ENDOCERVICAL ADENOCARCINOMA IN SITU (AIS).      - HIGH-GRADE SQUAMOUS INTRAEPITHELIAL LESION (HSIL/CIN-3).      - SEE CANCER SUMMARY BELOW.      - CHANGES CONSISTENT WITH PRIOR SURGICAL PROCEDURE.      - WEAKLY PROLIFERATIVE ENDOMETRIUM.      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      - BILATERAL FALLOPIAN TUBES WITH FIMBRIATED END AND PARATUBAL CYSTS.       Diagnosis Note : In the hysterectomy specimen (specimen 6) residual invasive      endocervical adenocarcinoma is present with the largest linear focus measuring      3.8 mm.In one tangential section of the cervix (limiting measurement of depth of      invasion) adenocarcnoma is present 0.3 mm from the radial / circumferential      margin in the 6:00-9:00 quadrant  location.In a well oriented section of the      cervix the deepest depth of invasion measures 7.8 mm where the wall thickness      measures 11.4 mm.BCL2 and p16 stains were performed to confirm the presence of      adenocarcinoma (p16 positive BCL2 negative).Stain controls worked appropriately.      A subset of slides on specimen 6 underwent intradepartmental consultation and      Dr. Macie and Dr. Delaine concur with the interpretation.   Final conclusion is that the cervix does not have residual cancer, just normal endocervical glands.  P16 immunohistochemical stains of multiple block are negative in the cells, in contrast to the prior invasive adenocarcinoma in the cold knife cone, which was HPV associated and strongly p16 positive (reviewed previously at [facility], see [phone]). It should be noted that tubal metaplasia  is a well-documented mimic of endocervical adenocarcinoma.   02/15/2024 PET scan  IMPRESSION: 1. Recent hysterectomy. Associated asymmetric hypermetabolism along the vaginal cuff is presumably postoperative in etiology. This examination will serve as baseline for future comparison. 2. Focal hypermetabolism in the small bowel mesentery or along the small bowel in the posterior central anatomic pelvis, worrisome for a metastasis. Consider CT or MR enterography in further evaluation, as clinically indicated. 3. Tiny retroperitoneal periaortic lymph nodes are not hypermetabolic but are visualized. Recommend attention on follow-up.  02/26/2024 CT Scan  ADDENDUM: In light of this additional information, I performed a focused review of the PET-CT of 02/15/2024 and the abdomen and pelvis CT of 02/26/2024. As stated in the report, the FDG accumulation identified in the vaginal cuff on the PET-CT could well be simply related to scarring/granulation from healing after hysterectomy performed on 01/27/2024, less than 3 weeks prior to the PET-CT. Subsequent CT of 02/26/2024 revealed some subtle  asymmetric enhancement in the region of the left vaginal cuff, but, again, this could be related to the prior surgery. Follow-up could be used to ensure stability.   The previous PET-CT also documented some focal hypermetabolism in the right pelvis thought to be related to the small bowel mesentery. The subsequent CT scan of 02/26/2024 documents no mesenteric soft tissue nodule or peritoneal implant in the right pelvis. The postcontrast CT imaging confirms that the PET finding represents the right ovary. There is no adnexal mass lesion and asymmetric uptake in the right ovary on the PET imaging could have been physiologic, given patient age. Follow-up pelvic ultrasound could be used to ensure stability of the right ovary.  Past Medical History:  Diagnosis Date   Adenocarcinoma in situ of endocervix    Anemia    Asymptomatic varicose veins of left lower extremity    Axillary hidradenitis suppurativa    Bilateral carpal tunnel syndrome    a.) (+) associated paraesthesias to BILATERAL hands   Gestational hypertension    has had issues with blood pressure since pregnancy off and on   Joint pain    Migraines    Raynaud's disease    Sleep apnea    Past Surgical History:  Procedure Laterality Date   BREAST BIOPSY Left 12/11/2022   US  LT BREAST BX W LOC DEV 1ST LESION IMG BX SPEC US  GUIDE 12/11/2022 [facility]   CARPAL TUNNEL RELEASE Right 06/30/2018   Procedure: CARPAL TUNNEL RELEASE ENDOSCOPIC;  Surgeon: Edie Norleen PARAS, MD;  Location: [facility];  Service: Orthopedics;  Laterality: Right;   CARPAL TUNNEL RELEASE Left 06/18/2021   Procedure: CARPAL TUNNEL RELEASE ENDOSCOPIC;  Surgeon: Edie Norleen PARAS, MD;  Location: [facility];  Service: Orthopedics;  Laterality: Left;   CERVICAL CONIZATION W/BX N/A 12/09/2023   Procedure: CONE BIOPSY, CERVIX; ENDOCERVICAL CURETTINGS;  Surgeon: Leonce Garnette BIRCH, MD;  Location: [facility];  Service: Gynecology;  Laterality: N/A;   EXAM UNDER  ANESTHESIA, PELVIC N/A 01/27/2024   Procedure: EXAM UNDER ANESTHESIA, PELVIC;  Surgeon: Elby Webb Loges, MD;  Location: [facility];  Service: Gynecology;  Laterality: N/A;   INJECTION, FOR SENTINEL LYMPH NODE IDENTIFICATION Bilateral 01/27/2024   Procedure: INJECTION, FOR SENTINEL LYMPH NODE IDENTIFICATION;  Surgeon: Elby Webb Loges, MD;  Location: [facility];  Service: Gynecology;  Laterality: Bilateral;   LYMPH NODE BIOPSY Bilateral 01/27/2024   Procedure: LYMPH NODE BIOPSY;  Surgeon: Elby Webb Loges, MD;  Location: [facility];  Service: Gynecology;  Laterality: Bilateral;   ROBOTIC ASSISTED TOTAL HYSTERECTOMY WITH BILATERAL  SALPINGO OOPHERECTOMY Bilateral 01/27/2024   Procedure: HYSTERECTOMY, TOTAL, ROBOT-ASSISTED, LAPAROSCOPIC, WITH BILATERAL SALPINGECTOMY;  Surgeon: Elby Webb Loges, MD;  Location: [facility];  Service: Gynecology;  Laterality: Bilateral;   OB History  Gravida Para Term Preterm AB Living  1 1 1   1   SAB IAB Ectopic Multiple Live Births     0 1    # Outcome Date GA Lbr Len/2nd Weight Sex Type Anes PTL Lv  1 Term 01/26/18 [redacted]w[redacted]d / 02:42 6 lb 9.8 oz (3 kg) M Vag-Spont EPI  LIV  Patient denies any other pertinent gynecologic issues.   Current Outpatient Medications on File Prior to Visit  Medication Sig Dispense Refill   zonisamide (ZONEGRAN) 100 MG capsule Take 100 mg by mouth daily.     ibuprofen  (ADVIL ) 600 MG tablet Take 1 tablet (600 mg total) by mouth every 6 (six) hours. 30 tablet 0   ondansetron  (ZOFRAN -ODT) 4 MG disintegrating tablet Take 1 tablet (4 mg total) by mouth every 6 (six) hours as needed for nausea. 20 tablet 0   oxyCODONE -acetaminophen  (PERCOCET) 5-325 MG tablet Take 1 tablet by mouth every 6 (six) hours as needed (breakthrough). 20 tablet 0   No current facility-administered medications on file prior to visit.   Allergies  Allergen Reactions   Penicillins Other (See Comments)    Doesn't know symptoms last had it as a child  As a  child    Social History:   reports that she has quit smoking. Her smoking use included cigarettes. She has never used smokeless tobacco. She reports that she does not currently use alcohol. She reports that she does not use drugs.  Family History  Problem Relation Age of Onset   Breast cancer Neg Hx     Review of Systems:  General: no complaints  HEENT: no complaints  Lungs: no complaints  Cardiac: no complaints  GI: abdominal pain  GU: no complaints GYN: no complaints  Musculoskeletal: no complaints  Extremities: no complaints  Skin: no complaints  Neuro: no complaints  Endocrine: no complaints  Psych: no complaints       PHYSICAL EXAM: Blood pressure 132/80, pulse 89, temperature (!) 97 F (36.1 C), resp. rate 20, weight 222 lb 8 oz (100.9 kg), last menstrual period 01/19/2024, SpO2 100%.  GENERAL: Patient is a well appearing female in no acute distress HEENT:  PERRL, neck supple with midline trachea.  NODES:  No cervical, supraclavicular, axillary, or inguinal lymphadenopathy palpated.  LUNGS: Normal respiratory effort ABDOMEN:  Soft, nontender.  No evidence of ascites masses or hernias EXTREMITIES:  No peripheral edema.   SKIN:  Clear with no obvious rashes or skin changes.  Well-healed incisions  NEURO:  Nonfocal. Well oriented.  Appropriate affect.  Pelvic: EGBUS: no lesions Cervix: Surgically absent Vagina: Small 3 mm vaginal lesion that appears to be granulation tissue right at the cuff. Bleeding with speculum exam Uterus: Surgically absent Adnexa: no palpable masses  Vaginal biopsy procedure Note:  Informed Consent obtained. Time out performed. Area cleansed with Hibiclens . Biopsy performed at vaginal cuff.  Only a small amount of tissue was removed. Hemostasis excellent with AgNO3. Patient reassessed after procedure and in stable condition. No complications.    Labs Lab Results  Component Value Date   WBC 7.2 12/09/2023   HGB 14.3 12/09/2023   HCT  41.9 12/09/2023   MCV 85.9 12/09/2023   PLT 242 12/09/2023    Imaging PET scan 02/15/24  NECK: Left maxillary periodontal inflammatory/infectious uptake. No abnormal  hypermetabolism.   Incidental CT findings:   None.   CHEST:   No abnormal hypermetabolism.   Incidental CT findings:   Heart is enlarged.  No pericardial or pleural effusion.   ABDOMEN/PELVIS:   Asymmetric ill-defined soft tissue and stranding in the region of the left vaginal cuff (6/133), SUV max 22.9, status post recent hysterectomy. Focal hypermetabolism associated with a mesenteric nodule or possible serosal implant in the posterior central anatomic pelvis, measuring approximately 2.3 cm (6/120), SUV max 16.3. Tiny periaortic lymph nodes do not show metabolism above blood pool but are visualized.   Incidental CT findings:   Small low-attenuation lesion in the inferior right hepatic lobe, too small to characterize.   SKELETON:   No abnormal hypermetabolism.   Incidental CT findings:   Minimal degenerative change in the spine.   IMPRESSION: 1. Recent hysterectomy. Associated asymmetric hypermetabolism along the vaginal cuff is presumably postoperative in etiology. This examination will serve as baseline for future comparison. 2. Focal hypermetabolism in the small bowel mesentery or along the small bowel in the posterior central anatomic pelvis, worrisome for a metastasis. Consider CT or MR enterography in further evaluation, as clinically indicated. 3. Tiny retroperitoneal periaortic lymph nodes are not hypermetabolic but are visualized. Recommend attention on follow-up.  02/26/2024 CT A/P ADDENDUM: I just discussed this case by telephone with Dr. Melene. Patient has a history of stage I A cervical adenocarcinoma and review of the cone biopsy at [facility] revealed negative margins. Per discussion with Dr. Mancil, the presence of metastatic disease would be distinctly uncommon for  this stage of adenocarcinoma.   In light of this additional information, I performed a focused review of the PET-CT of 02/15/2024 and the abdomen and pelvis CT of 02/26/2024. As stated in the report, the FDG accumulation identified in the vaginal cuff on the PET-CT could well be simply related to scarring/granulation from healing after hysterectomy performed on 01/27/2024, less than 3 weeks prior to the PET-CT. Subsequent CT of 02/26/2024 revealed some subtle asymmetric enhancement in the region of the left vaginal cuff, but, again, this could be related to the prior surgery. Follow-up could be used to ensure stability.   The previous PET-CT also documented some focal hypermetabolism in the right pelvis thought to be related to the small bowel mesentery. The subsequent CT scan of 02/26/2024 documents no mesenteric soft tissue nodule or peritoneal implant in the right pelvis. The postcontrast CT imaging confirms that the PET finding represents the right ovary. There is no adnexal mass lesion and asymmetric uptake in the right ovary on the PET imaging could have been physiologic, given patient age. Follow-up pelvic ultrasound could be used to ensure stability of the right ovary.     Electronically Signed   By: Camellia Candle M.D.   On: 03/09/2024 11:23    Addended by Candle Camellia, MD on 03/09/2024 11:26 AM    Study Result  Narrative & Impression  CLINICAL DATA:  Cervical cancer, assess treatment response, evaluate PET positive node in right hemipelvis. * Tracking Code: BO *   EXAM: CT ABDOMEN AND PELVIS WITH CONTRAST   TECHNIQUE: Multidetector CT imaging of the abdomen and pelvis was performed using the standard protocol following bolus administration of intravenous contrast.   RADIATION DOSE REDUCTION: This exam was performed according to the departmental dose-optimization program which includes automated exposure control, adjustment of the mA and/or kV according  to patient size and/or use of iterative reconstruction technique.   CONTRAST:  100mL OMNIPAQUE  IOHEXOL   300 MG/ML  SOLN   COMPARISON:  PET-CT February 15, 2024   FINDINGS: Lower chest: No acute abnormality.   Hepatobiliary: Too small to accurately characterize hepatic hypodensities were not hypermetabolic on prior PET-CT favored benign. Gallbladder is unremarkable. No biliary ductal dilation.   Pancreas: No pancreatic ductal dilation or evidence of acute inflammation.   Spleen: No splenomegaly.   Adrenals/Urinary Tract: No suspicious adrenal nodule/mass. No hydronephrosis. Kidneys demonstrate symmetric enhancement. Bladder diverticulum.   Stomach/Bowel: Radiopaque enteric contrast material traverses the ascending colon. Stomach is unremarkable for degree of distension. No pathologic dilation of small or large bowel. Noninflamed appendix.   Vascular/Lymphatic: Normal caliber abdominal aorta. Smooth IVC contours.   Nodularity in the right hemipelvis along the right iliac bifurcation measuring 16 x 19 mm on image 59/2 corresponding in location with the hypermetabolism seen on prior examination is favored to reflect the ovary over a lymph node. Probable ovarian tissue along the left anterior iliac chain on image 62/2.   No definite pathologically enlarged abdominal or pelvic lymph nodes.   Reproductive: Prior hysterectomy with asymmetric enhancing soft tissue nodularity and adjacent fluid along the left vaginal cuff which was hypermetabolic on prior PET-CT measures 2.3 x 2.3 cm on image 72/2.   Other: Trace pelvic free fluid.   Musculoskeletal: No acute osseous abnormality. No aggressive lytic or blastic lesion of bone.   IMPRESSION: 1. Prior hysterectomy with asymmetric enhancing soft tissue nodularity and adjacent fluid along the left vaginal cuff which was hypermetabolic on prior PET-CT measures 2.3 x 2.3 cm, suspicious for residual/recurrent disease. 2. Nodularity  in the right hemipelvis along the right iliac bifurcation measuring 16 x 19 mm corresponding in location with the hypermetabolism seen on prior PET-CT is favored to reflect the ovary over a lymph node. Given the asymmetric hypermetabolism between this location and that seen in the left ovary on prior examination could consider further evaluation by pelvic MRI with and without contrast versus continued attention on follow-up imaging. 3. No definite pathologically enlarged abdominal or pelvic lymph nodes. 4. Trace pelvic free fluid.   06/01/2024 Pelvic US   Study Result  Narrative & Impression  CLINICAL DATA:  Cervical cancer surveillance   EXAM: TRANSABDOMINAL AND TRANSVAGINAL ULTRASOUND OF PELVIS   TECHNIQUE: Both transabdominal and transvaginal ultrasound examinations of the pelvis were performed. Transabdominal technique was performed for global imaging of the pelvis including uterus, ovaries, adnexal regions, and pelvic cul-de-sac. It was necessary to proceed with endovaginal exam following the transabdominal exam to visualize the adnexa.   COMPARISON:  CT 02/26/2024, PET CT 02/15/2024   FINDINGS: Uterus   Surgically absent   Endometrium   Surgically absent.   Right ovary   Measurements: 2.7 x 2 x 1.7 cm = volume: 4.8 mL. Normal appearance/no adnexal mass.   Left ovary   Measurements: 2.6 x 1.3 x 1.3 cm = volume: 2.3 mL. Normal appearance/no adnexal mass.   Other findings   No abnormal free fluid.   IMPRESSION: Status post hysterectomy. Normal sonographic appearance of the ovaries.       Assessment and plan:   Theresa Davidson is a 44 y.o. 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 Cone biopsy with IUD removal 12/09/23. Stage 1A2 Adenocarcinoma of the cervix with 4 mm invasion, no LVSI and negative margins.  Silva pattern B. She does not desire future fertility. She underwent RA TLH/BS and SLN biopsies  on 01/27/24.  Ovaries preserved.  [facility] pathology showed  negative SLNs, but persistent adenocarcinoma in the cervix with 7 mm invasion and disease within 0.3 mm of cervical margin.  Slides were reviewed at [facility] for second opinion.  Dr. Alejos does not think there is residual adenocarcinoma or AIS, but rather inflamed endocervical glands.  He did p16 IHC on the key blocks, which confirmed this conclusion and discussed this with Dr Janel at [facility] pathology.    PET scan was ordered prior to Dr Matt review and showed some activity in the vaginal cuff that is likely post op changes.  There was also a 2.3 cm PET positive nodule in the right lower abdomen.  Dr Blietz/Radiology was consulted and CT was recommended, performed 02/26/24 and felt to confirm post op changes in the pelvis and that the right pelvic activity corresponds to the right ovary, which is still physiologically active.  In view of the final pathology and radiology results will plan for expectant management of stage IA2 endocervical carcinoma with negative SLNs.   Abdominal pain of uncertain etiology left lower quadrant with negative ultrasound.  Vaginal cuff lesion most likely granulation tissue biopsied and treated with silver  nitrate today    Plan:  We ordered a CT scan today of the abdomen and pelvis to further assess abdominal pain and follow-up on prior results.  We also performed a vaginal biopsy and sent to pathology and we obtained a Pap smear.  Follow-up in 3 weeks for these results.  I personally had a face to face interaction and evaluated the patient.  I have reviewed her history and available records and have performed the all portions of the physical exam and biopsy.  I agree with the above documentation, assessment and plan which was fully formulated by me.  Counseling was completed by me.   Tinnie Dawn, NP scribed the note.   Angeles Isidor Constable, MD    Cc: Garnette Mace, MD 07/20/2024 9:15 AM

## 2024-07-20 NOTE — Telephone Encounter (Signed)
 Called pt to sched CT - left vm asking for return call - LH

## 2024-07-21 ENCOUNTER — Encounter: Payer: Self-pay | Admitting: Obstetrics and Gynecology

## 2024-07-22 LAB — SURGICAL PATHOLOGY

## 2024-07-24 LAB — IGP, APTIMA HPV: HPV Aptima: NEGATIVE

## 2024-07-26 ENCOUNTER — Ambulatory Visit
Admission: RE | Admit: 2024-07-26 | Discharge: 2024-07-26 | Attending: Obstetrics and Gynecology | Admitting: Obstetrics and Gynecology

## 2024-07-26 DIAGNOSIS — C539 Malignant neoplasm of cervix uteri, unspecified: Secondary | ICD-10-CM

## 2024-07-26 DIAGNOSIS — R109 Unspecified abdominal pain: Secondary | ICD-10-CM

## 2024-07-26 MED ORDER — IOHEXOL 300 MG/ML  SOLN
100.0000 mL | Freq: Once | INTRAMUSCULAR | Status: AC | PRN
  Administered 2024-07-26: 100 mL via INTRAVENOUS

## 2024-08-10 ENCOUNTER — Inpatient Hospital Stay
